# Patient Record
Sex: Male | Born: 1980 | Hispanic: Yes | Smoking: Former smoker
Health system: Southern US, Community
[De-identification: ages and names within clinical notes are randomized; demographics above are authoritative.]

## PROBLEM LIST (undated history)

## (undated) DIAGNOSIS — M549 Dorsalgia, unspecified: Secondary | ICD-10-CM

## (undated) HISTORY — PX: WISDOM TOOTH EXTRACTION: SHX21

---

## 2013-11-02 ENCOUNTER — Emergency Department (HOSPITAL_BASED_OUTPATIENT_CLINIC_OR_DEPARTMENT_OTHER): Payer: Self-pay

## 2013-11-02 ENCOUNTER — Emergency Department (HOSPITAL_BASED_OUTPATIENT_CLINIC_OR_DEPARTMENT_OTHER)
Admission: EM | Admit: 2013-11-02 | Discharge: 2013-11-02 | Disposition: A | Discharge status: Home / Self Care | Payer: Self-pay | Attending: Emergency Medicine | Admitting: Emergency Medicine

## 2013-11-02 ENCOUNTER — Encounter (HOSPITAL_BASED_OUTPATIENT_CLINIC_OR_DEPARTMENT_OTHER): Payer: Self-pay | Admitting: Emergency Medicine

## 2013-11-02 DIAGNOSIS — F17200 Nicotine dependence, unspecified, uncomplicated: Secondary | ICD-10-CM | POA: Insufficient documentation

## 2013-11-02 DIAGNOSIS — R079 Chest pain, unspecified: Secondary | ICD-10-CM

## 2013-11-02 DIAGNOSIS — R072 Precordial pain: Secondary | ICD-10-CM | POA: Insufficient documentation

## 2013-11-02 LAB — CBC
HEMATOCRIT: 40.1 % (ref 39.0–52.0)
Hemoglobin: 14.1 g/dL (ref 13.0–17.0)
MCH: 30.6 pg (ref 26.0–34.0)
MCHC: 35.2 g/dL (ref 30.0–36.0)
MCV: 87 fL (ref 78.0–100.0)
Platelets: 310 10*3/uL (ref 150–400)
RBC: 4.61 MIL/uL (ref 4.22–5.81)
RDW: 12.6 % (ref 11.5–15.5)
WBC: 9.9 10*3/uL (ref 4.0–10.5)

## 2013-11-02 LAB — BASIC METABOLIC PANEL
BUN: 13 mg/dL (ref 6–23)
CO2: 26 mEq/L (ref 19–32)
Calcium: 9.7 mg/dL (ref 8.4–10.5)
Chloride: 103 mEq/L (ref 96–112)
Creatinine, Ser: 1 mg/dL (ref 0.50–1.35)
GFR calc Af Amer: >90 mL/min (ref >90)
Glucose, Bld: 109 mg/dL — ABNORMAL HIGH (ref 70–99)
POTASSIUM: 3.9 meq/L (ref 3.7–5.3)
Sodium: 140 mEq/L (ref 137–147)

## 2013-11-02 LAB — D-DIMER, QUANTITATIVE: D-Dimer, Quant: 0.73 ug/mL-FEU — ABNORMAL HIGH (ref 0.00–0.48)

## 2013-11-02 LAB — TROPONIN I: Troponin I: <0.3 ng/mL (ref <0.30)

## 2013-11-02 MED ORDER — BUPIVACAINE HCL (PF) 0.5 % IJ SOLN
INTRAMUSCULAR | Status: AC
  Filled 2013-11-02: qty 10

## 2013-11-02 MED ORDER — SODIUM CHLORIDE 0.9 % IV BOLUS (SEPSIS)
1000.0000 mL | Freq: Once | INTRAVENOUS | Status: AC
  Administered 2013-11-02: 1000 mL via INTRAVENOUS

## 2013-11-02 MED ORDER — IBUPROFEN 800 MG PO TABS: 800.0000 mg | ORAL_TABLET | Freq: Once | ORAL | Status: DC

## 2013-11-02 MED ORDER — IOHEXOL 350 MG/ML SOLN
100.0000 mL | Freq: Once | INTRAVENOUS | Status: AC | PRN
  Administered 2013-11-02: 100 mL via INTRAVENOUS

## 2013-11-02 MED ORDER — IBUPROFEN 800 MG PO TABS
800.0000 mg | ORAL_TABLET | Freq: Once | ORAL | Status: AC
  Administered 2013-11-02: 800 mg via ORAL
  Filled 2013-11-02: qty 1

## 2013-11-02 NOTE — ED Notes (Signed)
MD at bedside. 

## 2013-11-02 NOTE — Discharge Instructions (Signed)
Chest Pain (Nonspecific) °It is often hard to give a specific diagnosis for the cause of chest pain. There is always a chance that your pain could be related to something serious, such as a heart attack or a blood clot in the lungs. You need to follow up with your caregiver for further evaluation. °CAUSES  °· Heartburn. °· Pneumonia or bronchitis. °· Anxiety or stress. °· Inflammation around your heart (pericarditis) or lung (pleuritis or pleurisy). °· A blood clot in the lung. °· A collapsed lung (pneumothorax). It can develop suddenly on its own (spontaneous pneumothorax) or from injury (trauma) to the chest. °· Shingles infection (herpes zoster virus). °The chest wall is composed of bones, muscles, and cartilage. Any of these can be the source of the pain. °· The bones can be bruised by injury. °· The muscles or cartilage can be strained by coughing or overwork. °· The cartilage can be affected by inflammation and become sore (costochondritis). °DIAGNOSIS  °Lab tests or other studies, such as X-rays, electrocardiography, stress testing, or cardiac imaging, may be needed to find the cause of your pain.  °TREATMENT  °· Treatment depends on what may be causing your chest pain. Treatment may include: °· Acid blockers for heartburn. °· Anti-inflammatory medicine. °· Pain medicine for inflammatory conditions. °· Antibiotics if an infection is present. °· You may be advised to change lifestyle habits. This includes stopping smoking and avoiding alcohol, caffeine, and chocolate. °· You may be advised to keep your head raised (elevated) when sleeping. This reduces the chance of acid going backward from your stomach into your esophagus. °· Most of the time, nonspecific chest pain will improve within 2 to 3 days with rest and mild pain medicine. °HOME CARE INSTRUCTIONS  °· If antibiotics were prescribed, take your antibiotics as directed. Finish them even if you start to feel better. °· For the next few days, avoid physical  activities that bring on chest pain. Continue physical activities as directed. °· Do not smoke. °· Avoid drinking alcohol. °· Only take over-the-counter or prescription medicine for pain, discomfort, or fever as directed by your caregiver. °· Follow your caregiver's suggestions for further testing if your chest pain does not go away. °· Keep any follow-up appointments you made. If you do not go to an appointment, you could develop lasting (chronic) problems with pain. If there is any problem keeping an appointment, you must call to reschedule. °SEEK MEDICAL CARE IF:  °· You think you are having problems from the medicine you are taking. Read your medicine instructions carefully. °· Your chest pain does not go away, even after treatment. °· You develop a rash with blisters on your chest. °SEEK IMMEDIATE MEDICAL CARE IF:  °· You have increased chest pain or pain that spreads to your arm, neck, jaw, back, or abdomen. °· You develop shortness of breath, an increasing cough, or you are coughing up blood. °· You have severe back or abdominal pain, feel nauseous, or vomit. °· You develop severe weakness, fainting, or chills. °· You have a fever. °THIS IS AN EMERGENCY. Do not wait to see if the pain will go away. Get medical help at once. Call your local emergency services (911 in [place] Do not drive yourself to the hospital. °MAKE SURE YOU:  °· Understand these instructions. °· Will watch your condition. °· Will get help right away if you are not doing well or get worse. °Document Released: 02/14/2005 Document Revised: 07/30/2011 Document Reviewed: 12/11/2007 °[employer]® Patient Information ©2014 [employer]. ° °

## 2013-11-02 NOTE — ED Provider Notes (Signed)
CSN: [id number]     Arrival date & time 11/02/13  1250 History   First MD Initiated Contact with Patient 11/02/13 1259     Chief Complaint  Patient presents with  . Chest Pain     (Consider location/radiation/quality/duration/timing/severity/associated sxs/prior Treatment) Patient is a 33 y.o. male presenting with chest pain. The history is provided by the patient.  Chest Pain Pain location:  Substernal area Pain quality: sharp   Pain radiates to:  Does not radiate Pain radiates to the back: no   Pain severity:  Moderate Onset quality:  Gradual Duration:  4 days Timing:  Constant Progression:  Unchanged Chronicity:  New Context: breathing and stress   Relieved by:  Nothing Worsened by:  Deep breathing Ineffective treatments:  None tried Associated symptoms: no abdominal pain, no cough, no fever, no shortness of breath and not vomiting     History reviewed. No pertinent past medical history. History reviewed. No pertinent past surgical history. History reviewed. No pertinent family history. History  Substance Use Topics  . Smoking status: Current Every Day Smoker -- 0.50 packs/day    Types: Cigarettes  . Smokeless tobacco: Not on file  . Alcohol Use: No    Review of Systems  Constitutional: Negative for fever and chills.  Respiratory: Negative for cough and shortness of breath.   Cardiovascular: Positive for chest pain.  Gastrointestinal: Negative for vomiting and abdominal pain.  All other systems reviewed and are negative.     Allergies  Review of patient's allergies indicates no known allergies.  Home Medications   Prior to Admission medications   Not on File   BP 128/72  Pulse 79  Temp(Src) 98.3 F (36.8 C)  Resp 16  Ht 5\' 9"  (1.753 m)  Wt 210 lb (95.255 kg)  BMI 31.00 kg/m2  SpO2 100% Physical Exam  Constitutional: He is oriented to person, place, and time. He appears well-developed and well-nourished. No distress.  HENT:  Head: Normocephalic  and atraumatic.  Mouth/Throat: Oropharynx is clear and moist. No oropharyngeal exudate.  Eyes: EOM are normal. Pupils are equal, round, and reactive to light.  Neck: Normal range of motion. Neck supple.  Cardiovascular: Normal rate and regular rhythm.  Exam reveals no friction rub.   No murmur heard. Pulmonary/Chest: Effort normal and breath sounds normal. No respiratory distress. He has no wheezes. He has no rales. He exhibits no tenderness.  Abdominal: He exhibits no distension. There is no tenderness. There is no rebound.  Musculoskeletal: Normal range of motion. He exhibits no edema.  Neurological: He is alert and oriented to person, place, and time.  Skin: Skin is warm. No rash noted. He is not diaphoretic.    ED Course  Procedures (including critical care time) Labs Review Labs Reviewed  CBC  BASIC METABOLIC PANEL  TROPONIN I  D-DIMER, QUANTITATIVE    Imaging Review Ct Angio Chest Pe W/cm &/or Wo Cm  11/02/2013   CLINICAL DATA:  Chest pain.  EXAM: CT ANGIOGRAPHY CHEST WITH CONTRAST  TECHNIQUE: Multidetector CT imaging of the chest was performed using the standard protocol during bolus administration of intravenous contrast. Multiplanar CT image reconstructions and MIPs were obtained to evaluate the vascular anatomy.  CONTRAST:  100mL OMNIPAQUE IOHEXOL 350 MG/ML SOLN  COMPARISON:  None.  FINDINGS: The chest wall is unremarkable. The bony thorax is intact. No acute bony findings or destructive bony changes. No spinal canal compromise.  The heart is normal in size. No pericardial effusion. No mediastinal or hilar mass  or adenopathy. The aorta is normal in caliber. No dissection. The esophagus is grossly normal.  The pulmonary arterial tree is fairly well opacified. No filling defects to suggest pulmonary emboli.  Examination of the lung parenchyma demonstrates minimal dependent atelectasis but no infiltrates, edema or effusions. No worrisome pulmonary lesions. Tracheobronchial tree is  unremarkable.  The upper abdomen is unremarkable.  Review of the MIP images confirms the above findings.  IMPRESSION: No CT findings for pulmonary embolism.  No acute pulmonary findings.   Electronically Signed   By: Loralie ChampagneMark  Gallerani M.D.   On: 11/02/2013 14:49     EKG Interpretation   Date/Time:  Monday November 02 2013 13:03:34 EDT Ventricular Rate:  79 PR Interval:  146 QRS Duration: 94 QT Interval:  374 QTC Calculation: 428 R Axis:   87 Text Interpretation:  Normal sinus rhythm Normal ECG Similar to prior  Confirmed by Gwendolyn GrantWALDEN  MD, Rickeya Manus ([id number]) on 11/02/2013 2:54:46 PM      MDM   Final diagnoses:  Chest pain    70M with no hx presents with sharp chest pain, central, nonradiating. Worse with deep breaths. No alleviating factors. He thinks it is due to stress. No fevers, cough, N/V, SOB.  EKG ok, no PR depression or diffuse ST elevation. No pain on palpation, no pain with chest movement. With no reproducibility, will do labs, D-dimer. Cannot PERC since he is a smoker. Dimer elevated, CT scan negative for PE. CP likely from stress. Stable for discharge. I have reviewed all labs and imaging and considered them in my medical decision making.   Dagmar HaitWilliam Kaleb Linquist, MD 11/02/13 [phone]

## 2013-11-02 NOTE — ED Notes (Signed)
Pt c/o Chest pain/sob while laying down x 4 days

## 2014-12-16 ENCOUNTER — Emergency Department (HOSPITAL_BASED_OUTPATIENT_CLINIC_OR_DEPARTMENT_OTHER)
Admission: EM | Admit: 2014-12-16 | Discharge: 2014-12-16 | Disposition: A | Discharge status: Home / Self Care | Payer: Self-pay | Attending: Emergency Medicine | Admitting: Emergency Medicine

## 2014-12-16 ENCOUNTER — Encounter (HOSPITAL_BASED_OUTPATIENT_CLINIC_OR_DEPARTMENT_OTHER): Payer: Self-pay | Admitting: *Deleted

## 2014-12-16 DIAGNOSIS — Z720 Tobacco use: Secondary | ICD-10-CM | POA: Insufficient documentation

## 2014-12-16 DIAGNOSIS — J020 Streptococcal pharyngitis: Secondary | ICD-10-CM | POA: Insufficient documentation

## 2014-12-16 LAB — RAPID STREP SCREEN (MED CTR MEBANE ONLY): STREPTOCOCCUS, GROUP A SCREEN (DIRECT): POSITIVE — AB

## 2014-12-16 MED ORDER — PENICILLIN G BENZATHINE 1200000 UNIT/2ML IM SUSP
1.2000 10*6.[IU] | Freq: Once | INTRAMUSCULAR | Status: AC
  Administered 2014-12-16: 1.2 10*6.[IU] via INTRAMUSCULAR
  Filled 2014-12-16: qty 2

## 2014-12-16 MED ORDER — HYDROCODONE-ACETAMINOPHEN 7.5-325 MG/15ML PO SOLN
10.0000 mL | Freq: Once | ORAL | Status: DC
  Filled 2014-12-16: qty 15

## 2014-12-16 MED ORDER — HYDROCODONE-ACETAMINOPHEN 7.5-325 MG/15ML PO SOLN
15.0000 mL | Freq: Once | ORAL | Status: AC
  Administered 2014-12-16: 15 mL via ORAL

## 2014-12-16 MED ORDER — HYDROCODONE-ACETAMINOPHEN 7.5-325 MG/15ML PO SOLN: 10.0000 mL | Freq: Four times a day (QID) | ORAL | Status: DC | PRN

## 2014-12-16 NOTE — ED Notes (Signed)
Sore throat x 3 days- reports family member has had strep within the last month

## 2014-12-16 NOTE — ED Notes (Signed)
MD at bedside. 

## 2014-12-16 NOTE — Discharge Instructions (Signed)
Return to the ED with any concerns including difficulty breathing or swallowing, vomiting and not able to keep down liquids, decreased level of alertness/lethargy, or any other alarming symptoms °

## 2014-12-16 NOTE — ED Notes (Signed)
Pt d/c home with ride- directed to pharmacy to pick up medications

## 2014-12-16 NOTE — ED Provider Notes (Signed)
CSN: [id number]     Arrival date & time 12/16/14  4098 History   First MD Initiated Contact with Patient 12/16/14 [phone]     Chief Complaint  Patient presents with  . Sore Throat     (Consider location/radiation/quality/duration/timing/severity/associated sxs/prior Treatment) HPI  Pt presenting with c/o sore throat which has been present for the past 3 days.  Pain is in back of throat, worse with swallowing.  He has been drinking liquids well, but has not been able to eat solid foods.  No vomiting or diarrhea. No cough or difficulty breathing.  Was exposed to strep within the past month.  No rash.  He has tried ibuprofen without much relief.  Pain is constant and severe.   There are no other associated systemic symptoms, there are no other alleviating or modifying factors.   History reviewed. No pertinent past medical history. Past Surgical History  Procedure Laterality Date  . Wisdom tooth extraction     No family history on file. History  Substance Use Topics  . Smoking status: Current Every Day Smoker -- 0.50 packs/day    Types: Cigarettes  . Smokeless tobacco: Never Used  . Alcohol Use: No    Review of Systems  ROS reviewed and all otherwise negative except for mentioned in HPI    Allergies  Review of patient's allergies indicates no known allergies.  Home Medications   Prior to Admission medications   Medication Sig Start Date End Date Taking? Authorizing Provider  HYDROcodone-acetaminophen (HYCET) 7.5-325 mg/15 ml solution Take 10 mLs by mouth 4 (four) times daily as needed for moderate pain. 12/16/14 12/16/15  Jerelyn Scott, MD  ibuprofen (ADVIL,MOTRIN) 800 MG tablet Take 1 tablet (800 mg total) by mouth once. 11/02/13   Elwin Mocha, MD   BP 104/40 mmHg  Pulse 89  Temp(Src) 98.7 F (37.1 C) (Oral)  Resp 16  Ht  (1.727 m)  Wt 219 lb (99.338 kg)  BMI 33.31 kg/m2  SpO2 99%  Vitals reviewed Physical Exam  Physical Examination: General appearance - alert, well  appearing, and in no distress Mental status - alert, oriented to person, place, and time Eyes - no conjunctival injection, no scleral icterus Mouth - MMM, OP with moderate erythema, tonsils 3+ with bilateral exudate, palate symmetric, uvula midline Neck - supple, no significant adenopathy Chest - clear to auscultation, no wheezes, rales or rhonchi, symmetric air entry Heart - normal rate, regular rhythm, normal S1, S2, no murmurs, rubs, clicks or gallops Abdomen - soft, nontender, nondistended, no masses or organomegaly Neurological - alert, oriented, normal speech, Extremities - peripheral pulses normal, no pedal edema, no clubbing or cyanosis Skin - normal coloration and turgor, no rashes  ED Course  Procedures (including critical care time) Labs Review Labs Reviewed  RAPID STREP SCREEN (NOT AT [facility]) - Abnormal; Notable for the following:    Streptococcus, Group A Screen (Direct) POSITIVE (*)    All other components within normal limits    Imaging Review No results found.   EKG Interpretation None      MDM   Final diagnoses:  Strep pharyngitis    Pt with strep pharyngitis. Pt treated with bicillin and given lortab elixir for pain control.  He is drinking liquids at home, but decreased solids due to pain.  Encouraged to continue to work on po hydration.  No signs of PTA or other acute complication at this time.  Discharged with strict return precautions.  Pt agreeable with plan.    Johnny Bridge  Linker, MD 12/18/14 1445

## 2015-04-29 ENCOUNTER — Encounter (HOSPITAL_BASED_OUTPATIENT_CLINIC_OR_DEPARTMENT_OTHER): Payer: Self-pay | Admitting: *Deleted

## 2015-04-29 ENCOUNTER — Emergency Department (HOSPITAL_BASED_OUTPATIENT_CLINIC_OR_DEPARTMENT_OTHER)
Admission: EM | Admit: 2015-04-29 | Discharge: 2015-04-29 | Disposition: A | Discharge status: Home / Self Care | Payer: Self-pay | Attending: Emergency Medicine | Admitting: Emergency Medicine

## 2015-04-29 ENCOUNTER — Emergency Department (HOSPITAL_BASED_OUTPATIENT_CLINIC_OR_DEPARTMENT_OTHER): Payer: Self-pay

## 2015-04-29 DIAGNOSIS — Y9389 Activity, other specified: Secondary | ICD-10-CM | POA: Insufficient documentation

## 2015-04-29 DIAGNOSIS — W1840XA Slipping, tripping and stumbling without falling, unspecified, initial encounter: Secondary | ICD-10-CM | POA: Insufficient documentation

## 2015-04-29 DIAGNOSIS — S99922A Unspecified injury of left foot, initial encounter: Secondary | ICD-10-CM | POA: Insufficient documentation

## 2015-04-29 DIAGNOSIS — M722 Plantar fascial fibromatosis: Secondary | ICD-10-CM | POA: Insufficient documentation

## 2015-04-29 DIAGNOSIS — S99921A Unspecified injury of right foot, initial encounter: Secondary | ICD-10-CM | POA: Insufficient documentation

## 2015-04-29 DIAGNOSIS — Y990 Civilian activity done for income or pay: Secondary | ICD-10-CM | POA: Insufficient documentation

## 2015-04-29 DIAGNOSIS — F17210 Nicotine dependence, cigarettes, uncomplicated: Secondary | ICD-10-CM | POA: Insufficient documentation

## 2015-04-29 DIAGNOSIS — G5601 Carpal tunnel syndrome, right upper limb: Secondary | ICD-10-CM | POA: Insufficient documentation

## 2015-04-29 DIAGNOSIS — Y9289 Other specified places as the place of occurrence of the external cause: Secondary | ICD-10-CM | POA: Insufficient documentation

## 2015-04-29 MED ORDER — IBUPROFEN 800 MG PO TABS: 800.0000 mg | ORAL_TABLET | Freq: Three times a day (TID) | ORAL | Status: DC

## 2015-04-29 MED ORDER — IBUPROFEN 800 MG PO TABS
800.0000 mg | ORAL_TABLET | Freq: Once | ORAL | Status: AC
  Administered 2015-04-29: 800 mg via ORAL
  Filled 2015-04-29: qty 1

## 2015-04-29 NOTE — Discharge Instructions (Signed)
1. Medications: alternate naprosyn and tylenol for pain control, usual home medications 2. Treatment: rest, ice, elevate and use brace, drink plenty of fluids, gentle stretching 3. Follow Up: Please followup with orthopedics as directed or your PCP in 1 week if no improvement for discussion of your diagnoses and further evaluation after today's visit; if you do not have a primary care doctor use the resource guide provided to find one; Please return to the ER for worsening symptoms or other concerns    Plantar Fasciitis Plantar fasciitis is a painful foot condition that affects the heel. It occurs when the band of tissue that connects the toes to the heel bone (plantar fascia) becomes irritated. This can happen after exercising too much or doing other repetitive activities (overuse injury). The pain from plantar fasciitis can range from mild irritation to severe pain that makes it difficult for you to walk or move. The pain is usually worse in the morning or after you have been sitting or lying down for a while. CAUSES This condition may be caused by:  Standing for long periods of time.  Wearing shoes that do not fit.  Doing high-impact activities, including running, aerobics, and ballet.  Being overweight.  Having an abnormal way of walking (gait).  Having tight calf muscles.  Having high arches in your feet.  Starting a new athletic activity. SYMPTOMS The main symptom of this condition is heel pain. Other symptoms include:  Pain that gets worse after activity or exercise.  Pain that is worse in the morning or after resting.  Pain that goes away after you walk for a few minutes. DIAGNOSIS This condition may be diagnosed based on your signs and symptoms. Your health care provider will also do a physical exam to check for:  A tender area on the bottom of your foot.  A high arch in your foot.  Pain when you move your foot.  Difficulty moving your foot. You may also need to  have imaging studies to confirm the diagnosis. These can include:  X-rays.  Ultrasound.  MRI. TREATMENT  Treatment for plantar fasciitis depends on the severity of the condition. Your treatment may include:  Rest, ice, and over-the-counter pain medicines to manage your pain.  Exercises to stretch your calves and your plantar fascia.  A splint that holds your foot in a stretched, upward position while you sleep (night splint).  Physical therapy to relieve symptoms and prevent problems in the future.  Cortisone injections to relieve severe pain.  Extracorporeal shock wave therapy (ESWT) to stimulate damaged plantar fascia with electrical impulses. It is often used as a last resort before surgery.  Surgery, if other treatments have not worked after 12 months. HOME CARE INSTRUCTIONS  Take medicines only as directed by your health care provider.  Avoid activities that cause pain.  Roll the bottom of your foot over a bag of ice or a bottle of cold water. Do this for 20 minutes, 3-4 times a day.  Perform simple stretches as directed by your health care provider.  Try wearing athletic shoes with air-sole or gel-sole cushions or soft shoe inserts.  Wear a night splint while sleeping, if directed by your health care provider.  Keep all follow-up appointments with your health care provider. PREVENTION   Do not perform exercises or activities that cause heel pain.  Consider finding low-impact activities if you continue to have problems.  Lose weight if you need to. The best way to prevent plantar fasciitis is to  avoid the activities that aggravate your plantar fascia. SEEK MEDICAL CARE IF:  Your symptoms do not go away after treatment with home care measures.  Your pain gets worse.  Your pain affects your ability to move or do your daily activities.   This information is not intended to replace advice given to you by your health care provider. Make sure you discuss any  questions you have with your health care provider.   Document Released: 01/30/2001 Document Revised: 01/26/2015 Document Reviewed: 03/17/2014 Elsevier Interactive Patient Education 2016 [employer].    Emergency Department Resource Guide 1) Find a Doctor and Pay Out of Pocket Although you won't have to find out who is covered by your insurance plan, it is a good idea to ask around and get recommendations. You will then need to call the office and see if the doctor you have chosen will accept you as a new patient and what types of options they offer for patients who are self-pay. Some doctors offer discounts or will set up payment plans for their patients who do not have insurance, but you will need to ask so you aren't surprised when you get to your appointment.  2) Contact Your Local Health Department Not all health departments have doctors that can see patients for sick visits, but many do, so it is worth a call to see if yours does. If you don't know where your local health department is, you can check in your phone book. The CDC also has a tool to help you locate your state's health department, and many state websites also have listings of all of their local health departments.  3) Find a Walk-in Clinic If your illness is not likely to be very severe or complicated, you may want to try a walk in clinic. These are popping up all over the country in pharmacies, drugstores, and shopping centers. They're usually staffed by nurse practitioners or physician assistants that have been trained to treat common illnesses and complaints. They're usually fairly quick and inexpensive. However, if you have serious medical issues or chronic medical problems, these are probably not your best option.  No Primary Care Doctor: - Call Health Connect at  [phone] - they can help you locate a primary care doctor that  accepts your insurance, provides certain services, etc. - Physician Referral Service-  [phone]  Chronic Pain Problems: Organization         Address  Phone   Notes  [facility]  [phone] Patients need to be referred by their primary care doctor.   Medication Assistance: Organization         Address  Phone   Notes  [facility] Medication [facility] [address] [place] [place], [place] [address] [phone] --Must be a resident of [facility] -- Must have NO insurance coverage whatsoever (no Medicaid/ Medicare, etc.) -- The pt. MUST have a primary care doctor that directs their care regularly and follows them in the community   [facility]ist  [phone]   [employer]  [phone]    Agencies that provide inexpensive medical care: Organization         Address  Phone   Notes  [facility]  [phone]   [facility]    [phone]   [facility] [address] [place], [place] [address] [phone]   [facility] [address] [place] [place] [phone]  Planned Parenthood    [phone]   [facility]    [phone]   [facility]  [address], [place] Phone:  [phone], Fax:  [phone] Hours of Operation:  9 am - 6 pm, M-F.  Also accepts Medicaid/Medicare and self-pay.  [facility]  [address], [place] Phone: [phone], Fax: [phone]. Hours of Operation:  8:30 am - 5:30 pm, M-F.  Also accepts Medicaid and self-pay.  [facility] [place] [address], [place] Phone: [phone]   [facility] [address] Natasha Bence [place], [place] [phone], Ext. [phone] Mondays & Thursdays: 7-9 AM.  First 15 patients are seen on a first come, first serve basis.    Medicaid-accepting [facility] Providers:  Organization         Address  Phone   Notes  [facility] [address],  [place] [phone] Also accepts self-pay patients.  [facility] [address] Laurell Josephs [place], [place]  [phone]   [facility] [address], [place] [phone]   [facility] [address] [place] [phone]   Renaye Rakers [address], [place]   [phone] Only accepts [place] patients after they have their name applied to their card.   Self-Pay (no insurance) in [facility]:  Organization         Address  Phone   Notes  Sick[facility] Patients, [facility] [address] [place], [place] [phone]   [facility] [address] [place], [place] [phone]   [facility] [place]  [address], [place] [phone]   [facility]/Dr. Osei-Bonsu  [address] [place] or [address], [place] [phone] Phone number for both [place] and [place] locations is the same.  [facility] [address] [place] [phone]   [facility] [address] [place] or [address] [phone]   [facility] [address] [place] [phone], phone; [phone], fax Sees patients 1st and 3rd Saturday of every month.  Must not qualify for public or private insurance (i.e. Medicaid, Medicare, [place] Health Choice, Veterans' Benefits)  Household income should be no more than 200% of the poverty level The clinic cannot treat you if you are pregnant or think you are pregnant  Sexually transmitted diseases are not treated at the clinic.    Dental Care: Organization         Address  Phone  Notes  [facility] Department of [facility] [address] [place], [place] [phone] Accepts children up to age 70 who are enrolled in  [place] or [place] Health Choice; pregnant women with a Medicaid card; and children who have applied for Medicaid or [place] Health Choice, but were declined, whose parents can pay a reduced fee at time of service.  [facility]  [address], [place] [phone] Accepts children up to age 45 who are enrolled in [place] or [place] Health Choice; pregnant women with a Medicaid card; and children who have applied for Medicaid or [place] Health Choice, but were declined,  whose parents can pay a reduced fee at time of service.  [facility]  [address] [place], [place] [phone] Patients are seen by appointment only. Walk-ins are not accepted. [facility] will see patients 2 years of age and older. Monday - Tuesday (8am-5pm) Most Wednesdays (8:30-5pm) $30 per visit, cash only  [facility] Access [facility]  [address], [facility] [phone] Patients are seen by appointment only. Walk-ins are not accepted. [facility] will see patients 60 years of age and older. One Wednesday Evening (Monthly: Volunteer Based).  $30 per visit, cash only  [employer]  [phone] for adults; Children under age 63, call Graduate Pediatric Dentistry at [phone]. Children aged 35-14, please call [phone] to request a pediatric application.  Dental services are provided in all areas of dental care including fillings, crowns and bridges, complete and partial dentures, implants, gum treatment, root canals, and extractions. Preventive care is also provided. Treatment is provided to both adults and children. Patients are selected via a lottery and there is often a waiting list.   [facility] [address] [place]  [phone] [email or web address]   [facility] [address] [place], [place] [phone], Ext. [phone] Second and Fourth Thursday of each month, opens at 6:30  AM; Clinic ends at 9 AM.  Patients are seen on a first-come first-served basis, and a limited number are seen during each clinic.   [facility]  [address] Ether Griffins [place], [place] [phone]   Eligibility Requirements You must have lived in [place], [place], or [place] counties for at least the last three months.   You cannot be eligible for state or federal sponsored [place], including [employer], [place], or [employer].   You generally cannot be eligible for healthcare insurance through your employer.    How to apply: Eligibility screenings are held every Tuesday and Wednesday afternoon from 1:00 pm until 4:00 pm. You do not need an appointment for the interview!  [facility] [address] [place], [place] [phone]   [facility] Health Department  [phone]   [facility] Department  [phone]   [facility] Health Department  [phone]    Behavioral Health Resources in the Community: Intensive Outpatient Programs Organization         Address  Phone  Notes  [facility] [address], [place], [place] [phone]   [facility] [address] [place], [place] [phone]   [facility] [address], [place], [place]  [phone]   [facility] Mental Health [address],  [place], [place] [phone] or [phone]   Substance Abuse Resources Organization         Address  Phone  Notes  [facility]  [phone]   [facility]  [phone]   [facility]  [phone]   Floydene Flock  [phone]   Residential & Outpatient Substance Abuse Program  [phone]   Psychological Services Organization         Address  Phone  Notes  [facility] Behavioral Health  [phone]   [facility]  [phone]   [facility] Mental [facility] [address] [place] [phone] or  [phone]    Mobile Crisis Teams Organization         Address  Phone  Notes  [facility], Mobile  Crisis Care Unit  [phone]   [facility]  [address] [place], [place] [phone]   [facility] [address] [place] [phone]    Self-Help/Support Groups Organization         Address  Phone             Notes  [facility] [place] - variety of support groups  336- [phone] Call for more information  Narcotics Anonymous (NA), [facility] [address], [place]  2 meetings at this location   Statisticianesidential Treatment Programs Organization         Address  Phone  Notes  [facility] [address],    [place]  [phone]   [facility]  [address], [place] [phone], [place], [place] [phone]   [facility] [address], [place] [phone] Admissions: 8am-3pm M-F  [facility] [address]    [place], [place] [phone]   [facility] [address], [place], [place] [phone]   [facility] [address]  [place], [place] [phone]   [facility] - Intensive Outpatient [address], [place], [place] [phone]   [facility] ([facility] [address]  [place], [place] [phone] or [phone]   [facility] ([facility]) [address] [place], [place] [phone] Accepts Medicaid  [facility] [address]  [place] [phone] Substance Abuse/Addiction Treatment   [facility] Behavioral Health Resources Organization         Address  Phone  Notes  [facility]  [phone]) [phone]   Angie FavaJulie Brannon, PhD [address] [place], [place]   [phone](336) [phone] or [phone]   [facility]   [address] [place], [place] [phone]) [phone]   [facility] [address],  [place], [place] [phone]) [phone] Insurance/Medicaid/sponsorship through [facility]  [facility] [address]                                    [place], [place] [phone]) [phone] Therapy/tele-psych/case  [facility] [address] [place].   [place], [place] [phone]    Dr. Lolly MustacheArfeen  [phone](336) [phone]   [facility]  [employer] [facility] 1) [address], [place] 2) [address], [place] 3)  [address], [place] [phone](336) [phone](336) [phone]  [phone](336) [phone]   [facility] Child Abuse Hotline [phone](336) 7060474661 or [phone](336) [phone] (After Hours)

## 2015-04-29 NOTE — ED Notes (Signed)
Pt c/o bilateral foot pain for "months" worse when at work - pt works on his feet all day.  Pt has tried different shoes and insoles with no relief.  Pt states pain is worst in middle of his arch.  Saturday, pt hit his left heel on a metal chair at work, then tripping, and has since has lateral foot pain, worse with palpation and flexion of 4th and 5th left toes.

## 2015-04-29 NOTE — ED Notes (Signed)
Per pt report both feet hurting since working at [employer] about month ago then last week a metal jack rolled over lt foot.

## 2015-04-29 NOTE — ED Provider Notes (Signed)
CSN: [id number]     Arrival date & time 04/29/15  1436 History   None    Chief Complaint  Patient presents with  . Foot Pain     (Consider location/radiation/quality/duration/timing/severity/associated sxs/prior Treatment) The history is provided by the patient and medical records. No language interpreter was used.     Maxwell Kelly is a 34 y.o. male  with no known medical problems presents to the Emergency Department complaining of gradual, persistent, progressively worsening bilateral foot pain onset 1 month ago after beginning a new job at [employer]. Patient reports pain is worst in the morning after waking. He reports pain is also worse after being on his feet all day. He describes the pain as a burning, worst in the arch of his front and radiating to the ball of the foot and back to the heel. No treatments prior to arrival.   Patient reports that  6 days ago he struck his heel on a metal chair and then one of the jacks rolled over his left lateral foot. He reports increased pain in the left foot since this time. He denies numbness, weakness, tingling. He reports that he walks but it is painful.   He denies swelling or color change.   In addition patient complains about intermittent paresthesias in the right hand. He reports that it only happens when he is working on his computer at home. He reports buying a home wrist rest which seems to exacerbate the problem.  He reports bending his hand makes the symptoms worse as well. He denies weakness, dropping items from the hand, loss of sensation or paresthesias radiating up the forearm or into the shoulder.  He denies swelling, pain with palpation, erythema or fevers.     History reviewed. No pertinent past medical history. Past Surgical History  Procedure Laterality Date  . Wisdom tooth extraction     History reviewed. No pertinent family history. Social History  Substance Use Topics  . Smoking status: Current Every Day Smoker -- 0.50  packs/day    Types: Cigarettes  . Smokeless tobacco: Never Used  . Alcohol Use: No    Review of Systems  Constitutional: Negative for fever and chills.  Gastrointestinal: Negative for nausea and vomiting.  Musculoskeletal: Positive for arthralgias ( Bilateral foot pain). Negative for back pain, joint swelling, neck pain and neck stiffness.  Skin: Negative for wound.  Neurological: Negative for numbness.       Right hand paresthesia  Hematological: Does not bruise/bleed easily.  Psychiatric/Behavioral: The patient is not nervous/anxious.   All other systems reviewed and are negative.     Allergies  Review of patient's allergies indicates no known allergies.  Home Medications   Prior to Admission medications   Medication Sig Start Date End Date Taking? Authorizing Provider  ibuprofen (ADVIL,MOTRIN) 800 MG tablet Take 1 tablet (800 mg total) by mouth 3 (three) times daily. 04/29/15   Miken Stecher, PA-C   BP 121/74 mmHg  Pulse 73  Temp(Src) 97.8 F (36.6 C) (Oral)  Resp 20  Ht 5\' 8"  (1.727 m)  Wt 103.193 kg  BMI 34.60 kg/m2  SpO2 99% Physical Exam  Constitutional: He appears well-developed and well-nourished. No distress.  HENT:  Head: Normocephalic and atraumatic.  Eyes: Conjunctivae are normal.  Neck: Normal range of motion.  Cardiovascular: Normal rate, regular rhythm and intact distal pulses.   Capillary refill < 3 sec  Pulmonary/Chest: Effort normal and breath sounds normal.  Musculoskeletal: He exhibits tenderness. He exhibits no edema.  ROM: Full range of motion of all major joints in the bilateral lower extremities Tenderness to palpation of the arch of the foot with reproducible burning pain along the plantar fascia No swelling or erythema noted to the soles of the feet Full range of motion of all major joints in the bilateral upper extremities Positive Phalen's and Tinel's at the right wrist Tenderness to palpation along the lateral portion of the left  foot including the left fifth and fourth toes  Neurological: He is alert. Coordination normal.  Sensation intact at all and sharp in all 4 extremities Strength 5/5 of all major joints in all 4 extremities, strong and equal grip strength, normal gait and balance  Skin: Skin is warm and dry. He is not diaphoretic.  No tenting of the skin  Psychiatric: He has a normal mood and affect.  Nursing note and vitals reviewed.   ED Course  Procedures (including critical care time)  Imaging Review Dg Foot Complete Left  04/29/2015  CLINICAL DATA:  Bilateral foot pain.  Worse at work. EXAM: LEFT FOOT - COMPLETE 3+ VIEW COMPARISON:  None. FINDINGS: No fracture or dislocation of mid foot or forefoot. The phalanges are normal. The calcaneus is normal. No soft tissue abnormality. IMPRESSION: No acute osseous abnormality. Electronically Signed   By: Genevive Bi M.D.   On: 04/29/2015 15:52   I have personally reviewed and evaluated these images and lab results as part of my medical decision-making.    MDM   Final diagnoses:  Plantar fasciitis, bilateral  Foot injury, left, initial encounter  Carpal tunnel syndrome of right wrist   Maxwell Kelly presents with clinical exam consistent with carpal tunnel syndrome of the right wrist, bilateral plantar fasciitis.  X-ray of the left foot obtained due to tenderness along the left foot and fourth and fifth toes after injury approximately 1 week ago.  Patient X-Ray negative for obvious fracture or dislocation. Pain managed in ED. Pt advised to follow up with podiatry for further evaluation of his plantar fasciitis. Patient given wrist splint for carpal tunnel and encouraged to follow-up with primary care physician. Conservative therapy recommended and discussed. Patient will be dc home & is agreeable with above plan.   BP 121/74 mmHg  Pulse 73  Temp(Src) 97.8 F (36.6 C) (Oral)  Resp 20  Ht  (1.727 m)  Wt 103.193 kg  BMI 34.60 kg/m2  SpO2  99%   Dierdre Forth, PA-C 04/29/15 1625  Lyndal Pulley, MD 04/29/15 [phone]

## 2016-01-25 IMAGING — CT CT ANGIO CHEST
2 of 6 series · 19 of 36 positions shown · IV contrast (APPLIED)
Comparison: None.

CLINICAL DATA: Chest pain.

EXAM:
CT ANGIOGRAPHY CHEST WITH CONTRAST
TECHNIQUE: Multidetector CT imaging of the chest was performed using the
standard protocol during bolus administration of intravenous
contrast. Multiplanar CT image reconstructions and MIPs were
obtained to evaluate the vascular anatomy.
CONTRAST:  100mL OMNIPAQUE IOHEXOL 350 MG/ML SOLN

[Series 6: pe 1.0 b26f · axial · 0.68mm/px · z∈[-160,+56]mm · 18 of 242 slices shown]
[im 13/242  lung]
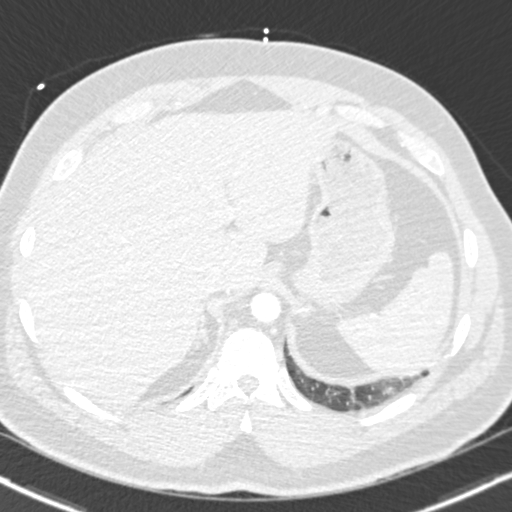
[im 25/242  mediastinal]
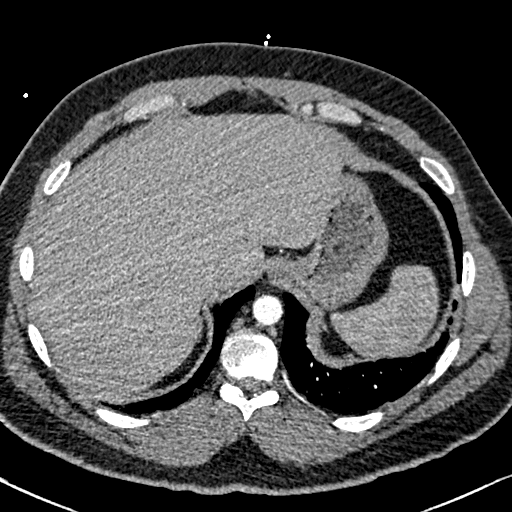
[im 37/242  lung]
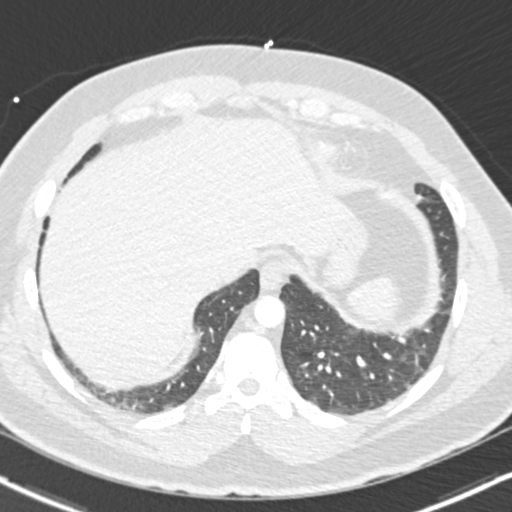
[im 49/242  mediastinal]
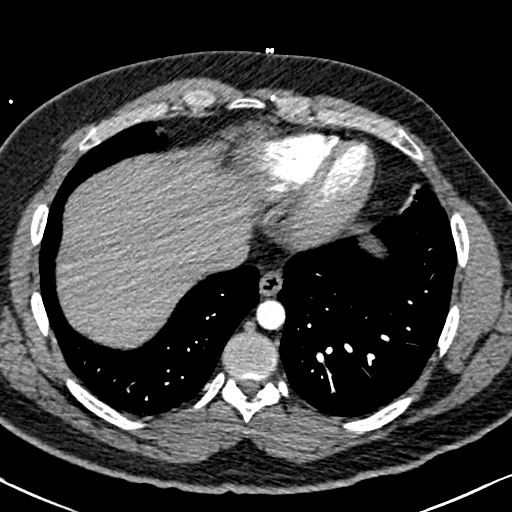
[im 61/242  lung]
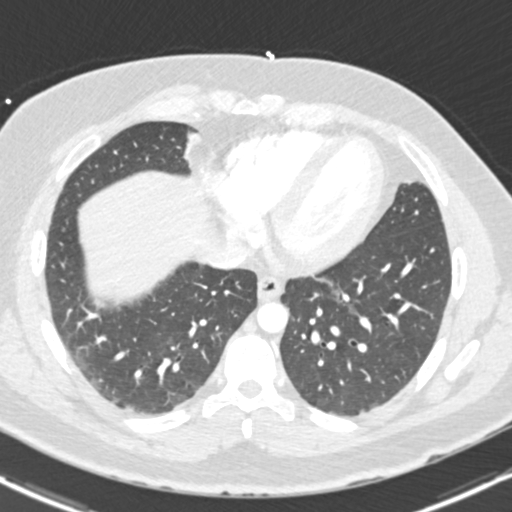
[im 73/242  mediastinal]
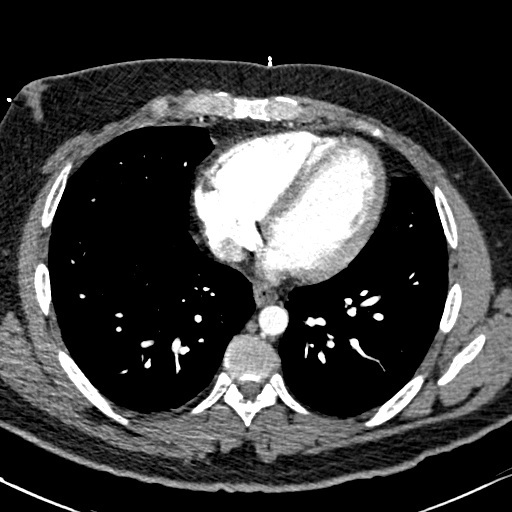
[im 85/242  lung]
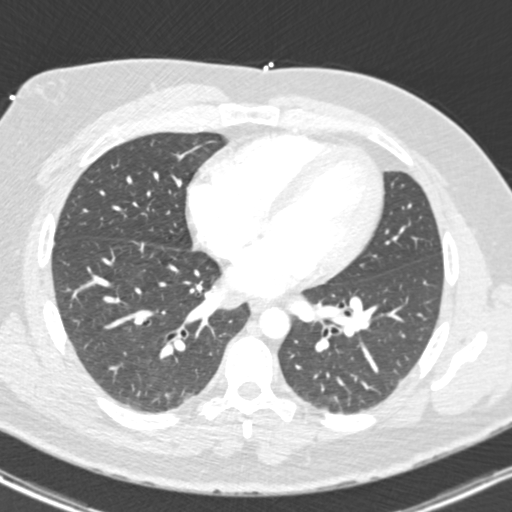
[im 97/242  mediastinal]
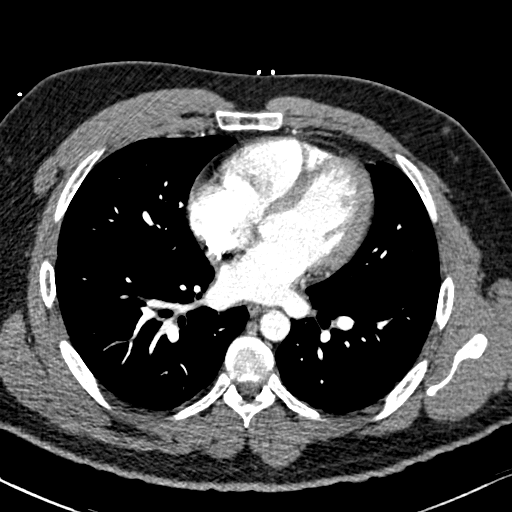
[im 109/242  lung]
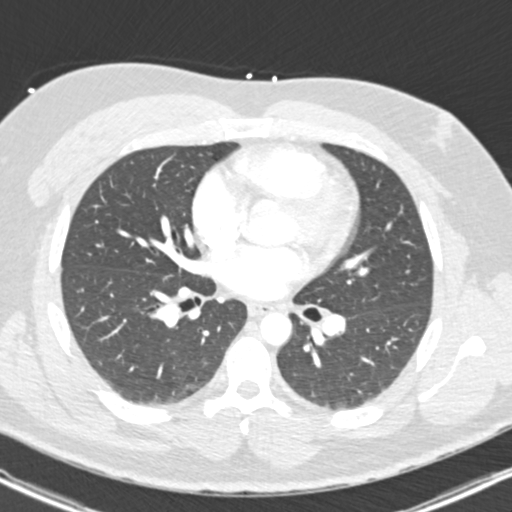
[im 133/242  mediastinal]
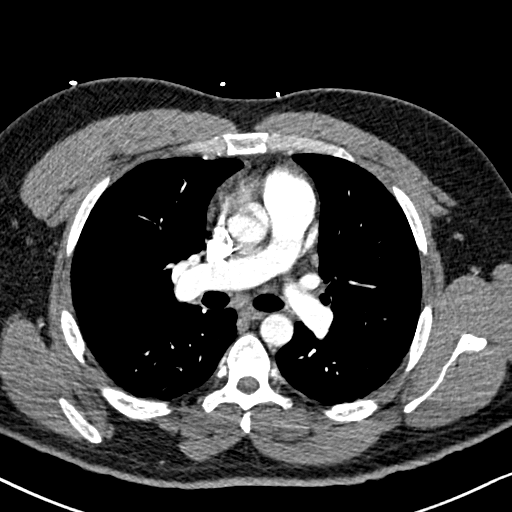
[im 145/242  lung]
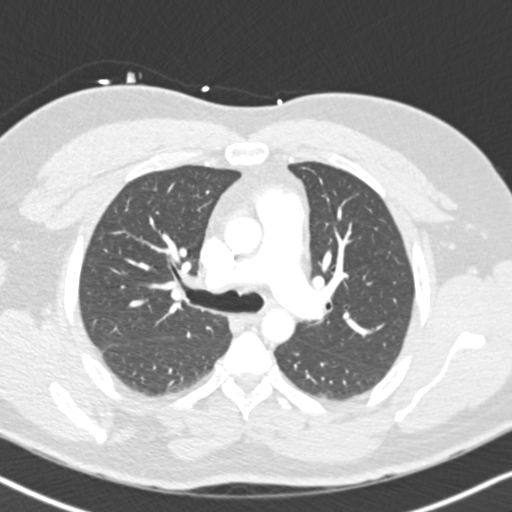
[im 157/242  mediastinal]
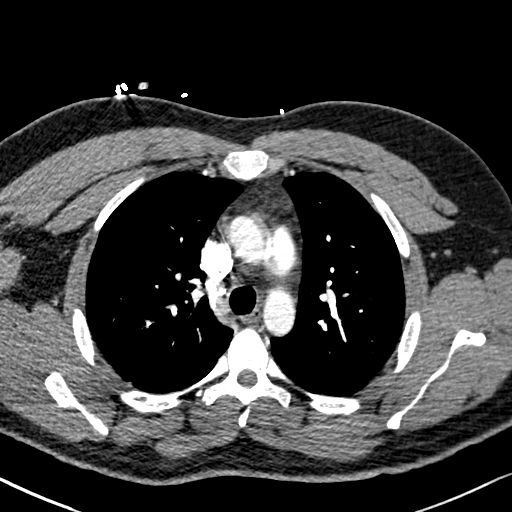
[im 169/242  lung]
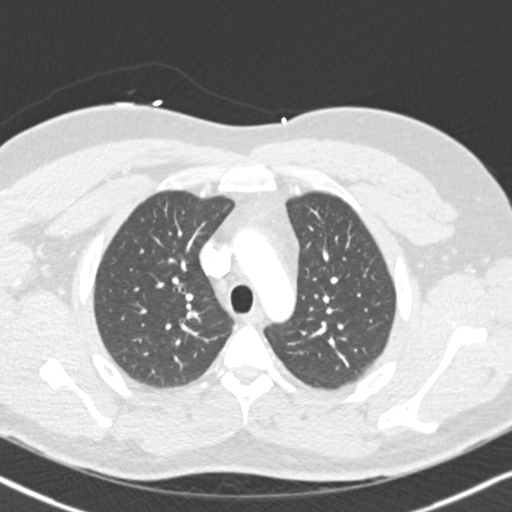
[im 181/242  mediastinal]
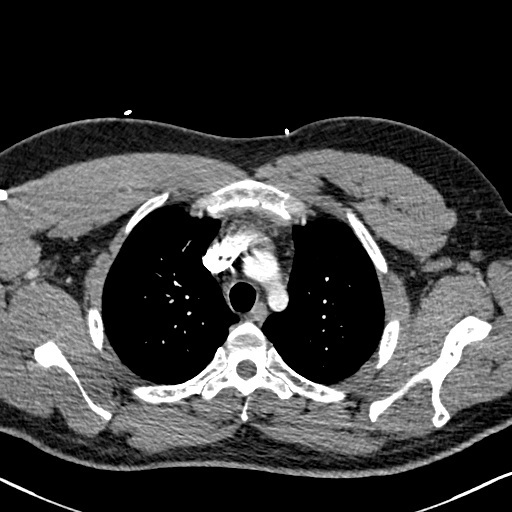
[im 193/242  lung]
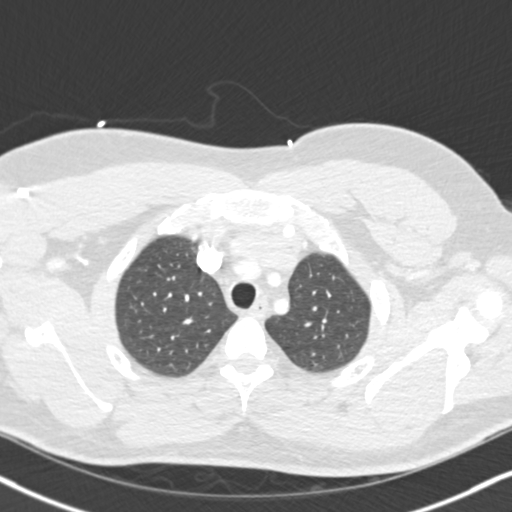
[im 205/242  mediastinal]
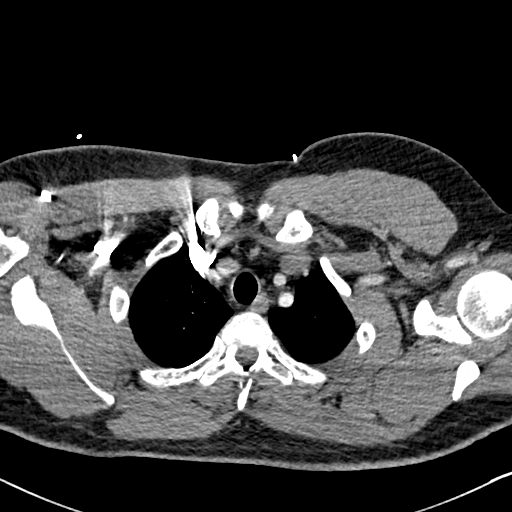
[im 217/242  lung]
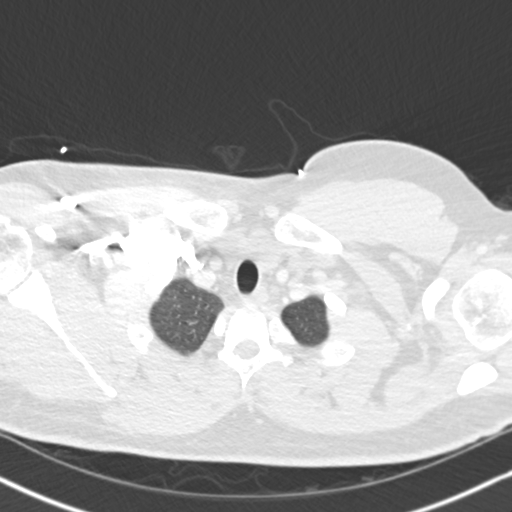
[im 229/242  mediastinal]
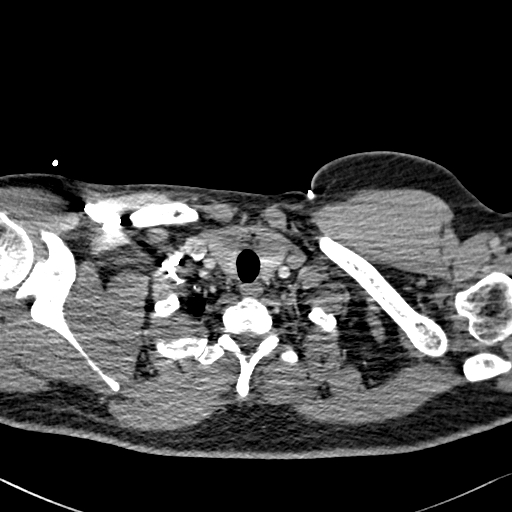

[Series 9: pe 2.0 coronal · coronal · 0.49mm/px · 1 of 143 slices shown]
[im 72/143  mediastinal]
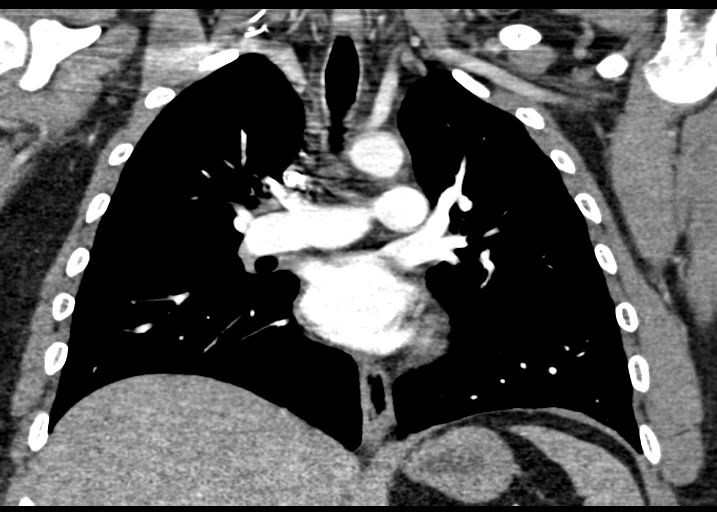

[19 of 36 positions shown; findings below may reference images not displayed]

FINDINGS: The chest wall is unremarkable. The bony thorax is intact. No acute
bony findings or destructive bony changes. No spinal canal
compromise.

The heart is normal in size. No pericardial effusion. No mediastinal
or hilar mass or adenopathy. The aorta is normal in caliber. No
dissection. The esophagus is grossly normal.

The pulmonary arterial tree is fairly well opacified. No filling
defects to suggest pulmonary emboli.

Examination of the lung parenchyma demonstrates minimal dependent
atelectasis but no infiltrates, edema or effusions. No worrisome
pulmonary lesions. Tracheobronchial tree is unremarkable.

The upper abdomen is unremarkable.

Review of the MIP images confirms the above findings.
IMPRESSION: No CT findings for pulmonary embolism.

No acute pulmonary findings.

## 2017-07-21 IMAGING — DX DG FOOT COMPLETE 3+V*L*
3 series · 3 of 3 positions shown · non-contrast
Comparison: None.

CLINICAL DATA: Bilateral foot pain.  Worse at work.

EXAM:
LEFT FOOT - COMPLETE 3+ VIEW

[foot ap]
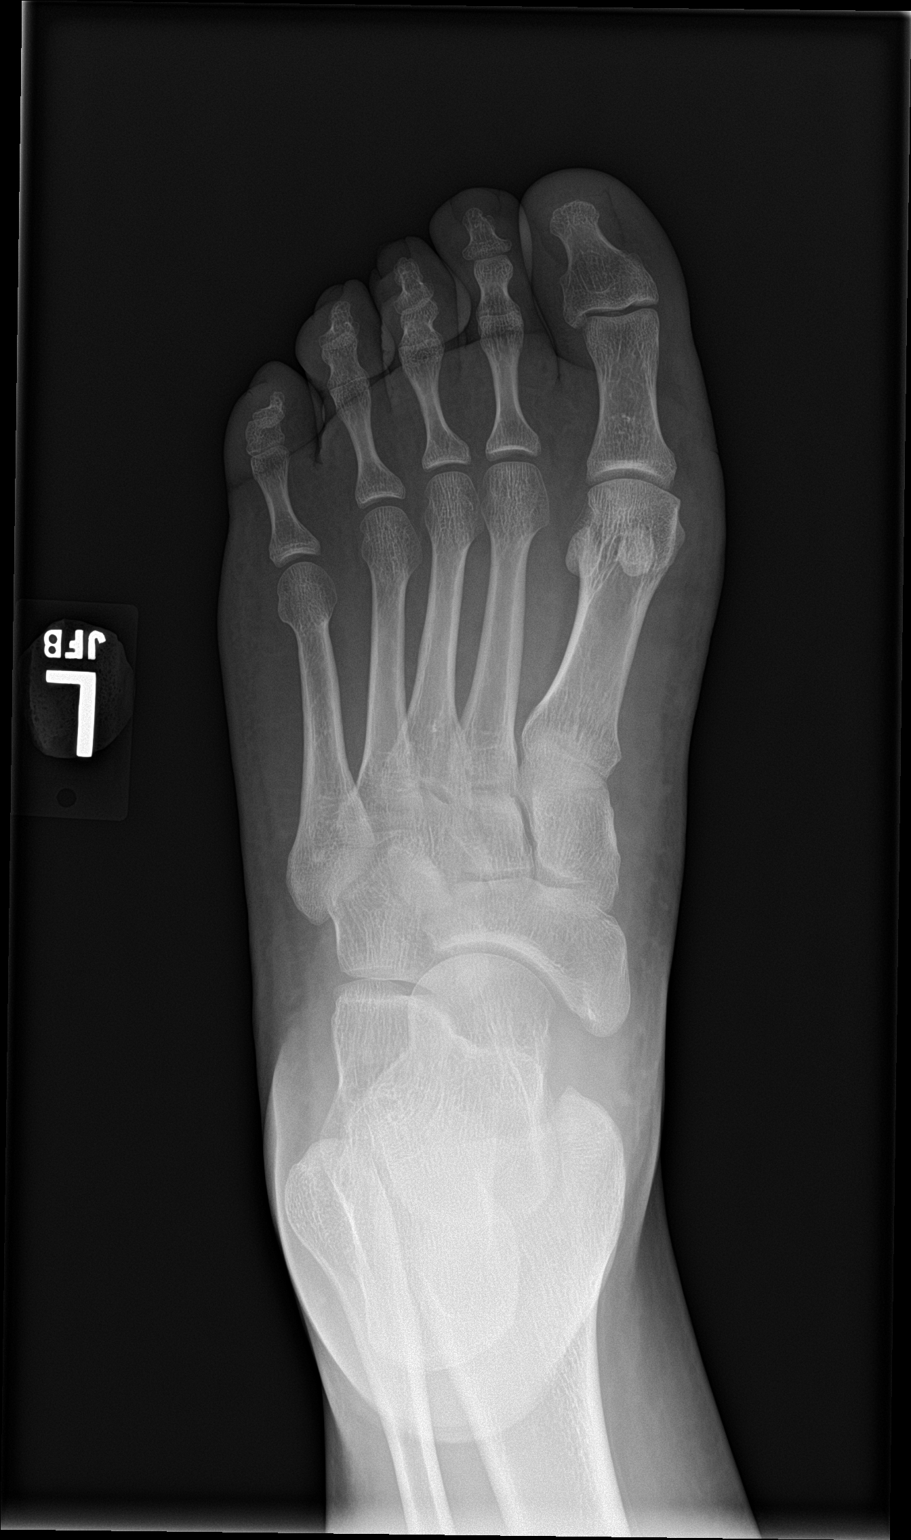

[foot obl]
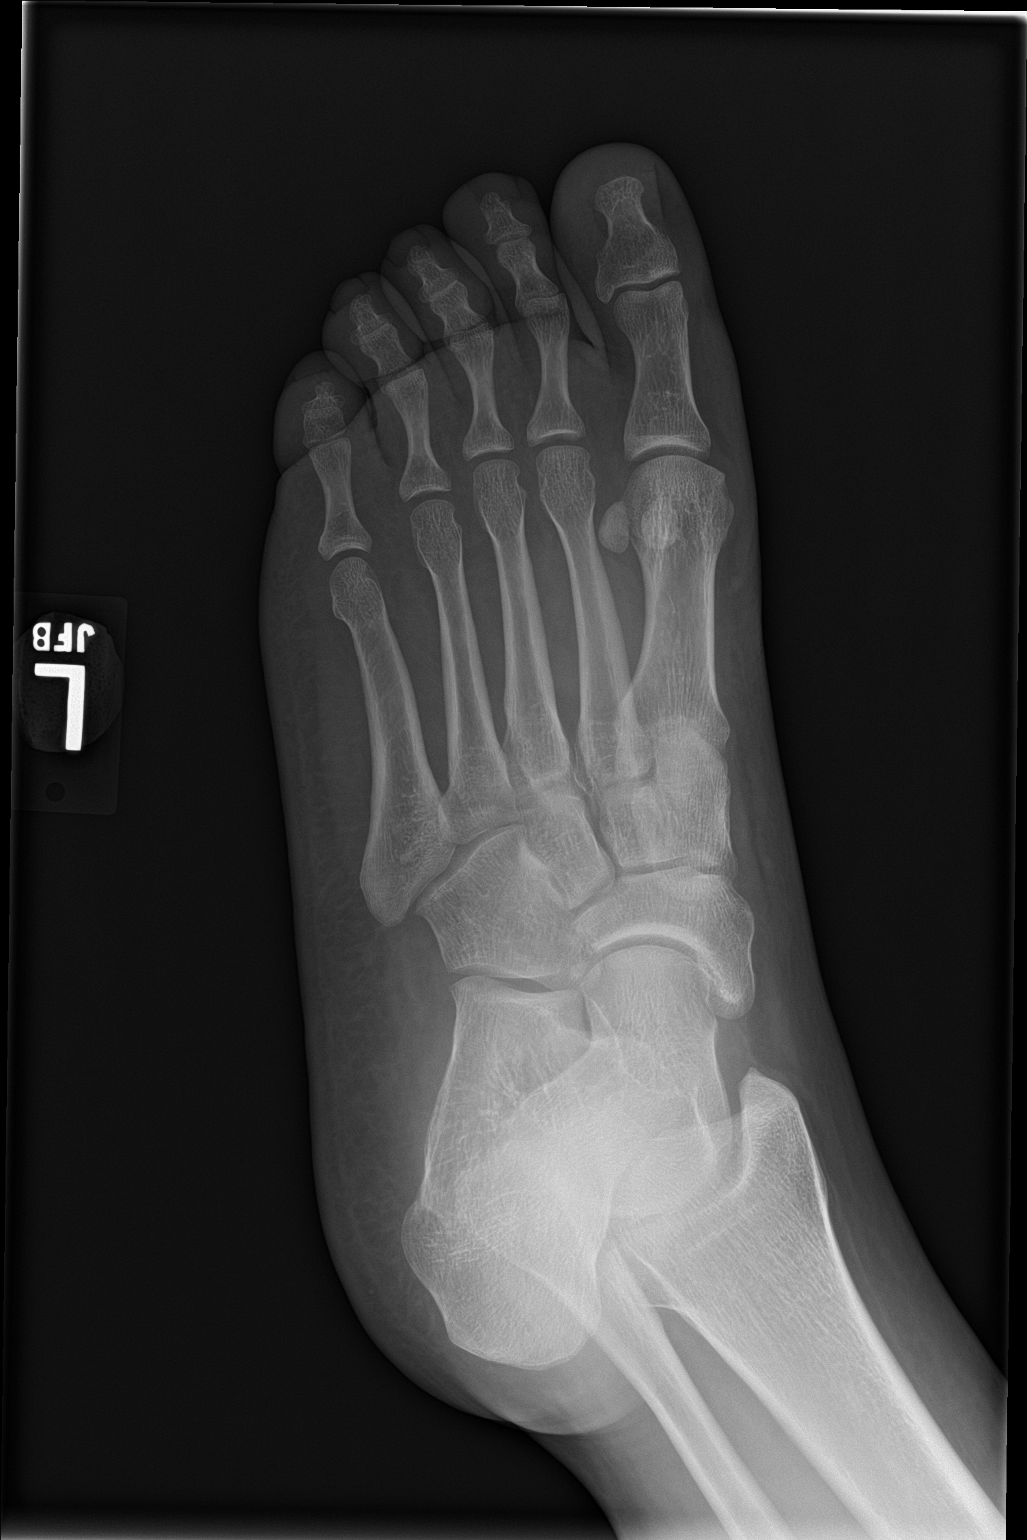

[foot lat]
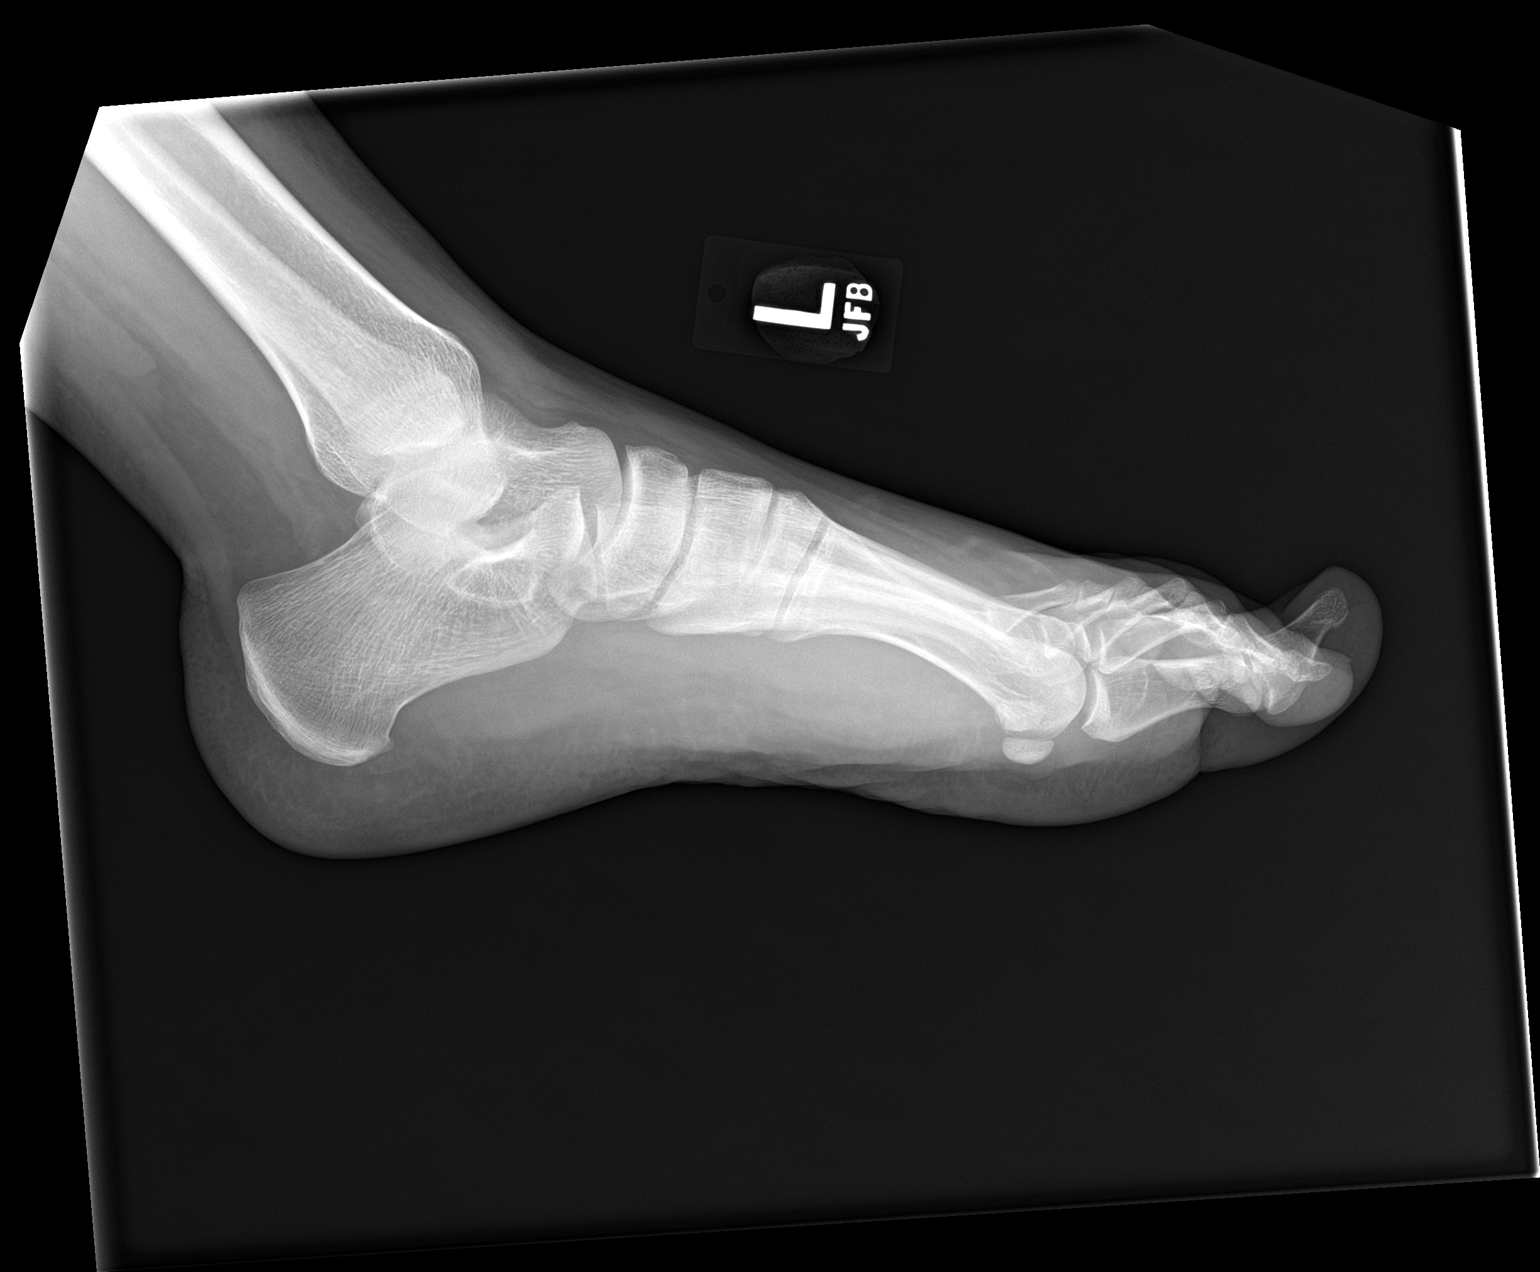

[3 of 3 positions shown; findings below may reference images not displayed]

FINDINGS: No fracture or dislocation of mid foot or forefoot. The phalanges
are normal. The calcaneus is normal. No soft tissue abnormality.
IMPRESSION: No acute osseous abnormality.

## 2019-03-25 ENCOUNTER — Emergency Department (HOSPITAL_BASED_OUTPATIENT_CLINIC_OR_DEPARTMENT_OTHER)
Admission: EM | Admit: 2019-03-25 | Discharge: 2019-03-25 | Disposition: A | Discharge status: Home / Self Care | Payer: Self-pay | Attending: Emergency Medicine | Admitting: Emergency Medicine

## 2019-03-25 ENCOUNTER — Other Ambulatory Visit: Payer: Self-pay

## 2019-03-25 ENCOUNTER — Encounter (HOSPITAL_BASED_OUTPATIENT_CLINIC_OR_DEPARTMENT_OTHER): Payer: Self-pay

## 2019-03-25 DIAGNOSIS — F17210 Nicotine dependence, cigarettes, uncomplicated: Secondary | ICD-10-CM | POA: Insufficient documentation

## 2019-03-25 DIAGNOSIS — Y9289 Other specified places as the place of occurrence of the external cause: Secondary | ICD-10-CM | POA: Insufficient documentation

## 2019-03-25 DIAGNOSIS — Y939 Activity, unspecified: Secondary | ICD-10-CM | POA: Insufficient documentation

## 2019-03-25 DIAGNOSIS — M5431 Sciatica, right side: Secondary | ICD-10-CM | POA: Insufficient documentation

## 2019-03-25 DIAGNOSIS — Y999 Unspecified external cause status: Secondary | ICD-10-CM | POA: Insufficient documentation

## 2019-03-25 DIAGNOSIS — M5432 Sciatica, left side: Secondary | ICD-10-CM | POA: Insufficient documentation

## 2019-03-25 DIAGNOSIS — X509XXA Other and unspecified overexertion or strenuous movements or postures, initial encounter: Secondary | ICD-10-CM | POA: Insufficient documentation

## 2019-03-25 MED ORDER — KETOROLAC TROMETHAMINE 15 MG/ML IJ SOLN
15.0000 mg | Freq: Once | INTRAMUSCULAR | Status: AC
  Administered 2019-03-25: 15 mg via INTRAMUSCULAR
  Filled 2019-03-25: qty 1

## 2019-03-25 MED ORDER — ACETAMINOPHEN 500 MG PO TABS
1000.0000 mg | ORAL_TABLET | Freq: Once | ORAL | Status: AC
  Administered 2019-03-25: 19:00:00 1000 mg via ORAL
  Filled 2019-03-25: qty 2

## 2019-03-25 MED ORDER — DIAZEPAM 5 MG PO TABS
5.0000 mg | ORAL_TABLET | Freq: Once | ORAL | Status: AC
  Administered 2019-03-25: 19:00:00 5 mg via ORAL
  Filled 2019-03-25: qty 1

## 2019-03-25 MED ORDER — OXYCODONE HCL 5 MG PO TABS
5.0000 mg | ORAL_TABLET | Freq: Once | ORAL | Status: AC
  Administered 2019-03-25: 5 mg via ORAL
  Filled 2019-03-25: qty 1

## 2019-03-25 NOTE — ED Provider Notes (Signed)
[facility] Provider Note   CSN: [id number] Arrival date & time: 03/25/19  1805     History   Chief Complaint Chief Complaint  Patient presents with  . Back Pain    HPI Maxwell Kelly is a 38 y.o. male.     38 yo M with a chief complaints of low back pain.  This radiates down both legs.  Is a recurrent issue for him.  States that he pulled something at work and felt some pain.  Denies overt trauma.  Denies loss of bowel or bladder denies loss of peritoneal sensation denies numbness or tingling to the legs.  Denies fever denies IV drug abuse denies recent surgery or instrumentation of his back.  The history is provided by the patient.  Back Pain Location:  Lumbar spine Quality:  Shooting and stabbing Radiates to:  L foot and R foot Pain severity:  Moderate Onset quality:  Gradual Duration:  2 days Timing:  Constant Progression:  Worsening Chronicity:  Recurrent Relieved by:  Nothing Worsened by:  Palpation, twisting, standing and ambulation Ineffective treatments:  None tried Associated symptoms: no abdominal pain, no chest pain, no fever and no headaches     History reviewed. No pertinent past medical history.  There are no active problems to display for this patient.   Past Surgical History:  Procedure Laterality Date  . WISDOM TOOTH EXTRACTION          Home Medications    Prior to Admission medications   Medication Sig Start Date End Date Taking? Authorizing Provider  ibuprofen (ADVIL,MOTRIN) 800 MG tablet Take 1 tablet (800 mg total) by mouth 3 (three) times daily. 04/29/15   Muthersbaugh, Jarrett Soho, PA-C    Family History No family history on file.  Social History Social History   Tobacco Use  . Smoking status: Current Every Day Smoker    Packs/day: 0.50    Types: Cigarettes  . Smokeless tobacco: Never Used  Substance Use Topics  . Alcohol use: No  . Drug use: No     Allergies   Patient has no known allergies.    Review of Systems Review of Systems  Constitutional: Negative for chills and fever.  HENT: Negative for congestion and facial swelling.   Eyes: Negative for discharge and visual disturbance.  Respiratory: Negative for shortness of breath.   Cardiovascular: Negative for chest pain and palpitations.  Gastrointestinal: Negative for abdominal pain, diarrhea and vomiting.  Musculoskeletal: Positive for back pain. Negative for arthralgias and myalgias.  Skin: Negative for color change and rash.  Neurological: Negative for tremors, syncope and headaches.  Psychiatric/Behavioral: Negative for confusion and dysphoric mood.     Physical Exam Updated Vital Signs BP 126/74 (BP Location: Left Arm)   Pulse 89   Temp 98 F (36.7 C) (Oral)   Resp 16   Ht 5\' 6"  (1.676 m)   Wt 113.4 kg   SpO2 100%   BMI 40.35 kg/m   Physical Exam Vitals signs and nursing note reviewed.  Constitutional:      Appearance: He is well-developed.  HENT:     Head: Normocephalic and atraumatic.  Eyes:     Pupils: Pupils are equal, round, and reactive to light.  Neck:     Musculoskeletal: Normal range of motion and neck supple.     Vascular: No JVD.  Cardiovascular:     Rate and Rhythm: Normal rate and regular rhythm.     Heart sounds: No murmur. No friction rub.  No gallop.   Pulmonary:     Effort: No respiratory distress.     Breath sounds: No wheezing.  Abdominal:     General: There is no distension.     Tenderness: There is no guarding or rebound.  Musculoskeletal: Normal range of motion.        General: Tenderness present.     Comments: Mild tenderness worse at the right SI joint.  Pulse motor and sensation is intact to bilateral lower extremities.  Negative straight leg raise test bilaterally.  Reflexes are 2+ and equal bilaterally.  No clonus.  Skin:    Coloration: Skin is not pale.     Findings: No rash.  Neurological:     Mental Status: He is alert and oriented to person, place, and time.   Psychiatric:        Behavior: Behavior normal.      ED Treatments / Results  Labs (all labs ordered are listed, but only abnormal results are displayed) Labs Reviewed - No data to display  EKG None  Radiology No results found.  Procedures Procedures (including critical care time)  Medications Ordered in ED Medications  acetaminophen (TYLENOL) tablet 1,000 mg (has no administration in time range)  ketorolac (TORADOL) 15 MG/ML injection 15 mg (has no administration in time range)  oxyCODONE (Oxy IR/ROXICODONE) immediate release tablet 5 mg (has no administration in time range)  diazepam (VALIUM) tablet 5 mg (has no administration in time range)     Initial Impression / Assessment and Plan / ED Course  I have reviewed the triage vital signs and the nursing notes.  Pertinent labs & imaging results that were available during my care of the patient were reviewed by me and considered in my medical decision making (see chart for details).        38 yo M with a chief complaints of low back pain that radiates down the leg.  This is a recurrent issue for him.  Occurs bilaterally.  Has had this problem in the past.  No trauma other than bilateral radiation no red flags.  Benign exam.  Ambulatory.  Will treat symptomatically here.  PCP follow-up.  7:12 PM:  I have discussed the diagnosis/risks/treatment options with the patient and believe the pt to be eligible for discharge home to follow-up with PCP. We also discussed returning to the ED immediately if new or worsening sx occur. We discussed the sx which are most concerning (e.g., sudden worsening pain, fever, inability to tolerate by mouth, cauda equina s/sx) that necessitate immediate return. Medications administered to the patient during their visit and any new prescriptions provided to the patient are listed below.  Medications given during this visit Medications  acetaminophen (TYLENOL) tablet 1,000 mg (has no administration in  time range)  ketorolac (TORADOL) 15 MG/ML injection 15 mg (has no administration in time range)  oxyCODONE (Oxy IR/ROXICODONE) immediate release tablet 5 mg (has no administration in time range)  diazepam (VALIUM) tablet 5 mg (has no administration in time range)     The patient appears reasonably screen and/or stabilized for discharge and I doubt any other medical condition or other [facility] requiring further screening, evaluation, or treatment in the ED at this time prior to discharge.    Final Clinical Impressions(s) / ED Diagnoses   Final diagnoses:  Bilateral sciatica    ED Discharge Orders    None       Melene Plan, DO 03/25/19 1912

## 2019-03-25 NOTE — ED Triage Notes (Signed)
Pt c/o lower back radiating down both LE-started 2 days ago-denies injury- feels like sciatica-NAD-steady slow gait

## 2019-03-25 NOTE — Discharge Instructions (Signed)

## 2019-05-30 ENCOUNTER — Emergency Department (HOSPITAL_BASED_OUTPATIENT_CLINIC_OR_DEPARTMENT_OTHER)
Admission: EM | Admit: 2019-05-30 | Discharge: 2019-05-30 | Disposition: A | Discharge status: Home / Self Care | Payer: 59 | Attending: Emergency Medicine | Admitting: Emergency Medicine

## 2019-05-30 ENCOUNTER — Encounter (HOSPITAL_BASED_OUTPATIENT_CLINIC_OR_DEPARTMENT_OTHER): Payer: Self-pay | Admitting: *Deleted

## 2019-05-30 ENCOUNTER — Other Ambulatory Visit: Payer: Self-pay

## 2019-05-30 DIAGNOSIS — F17210 Nicotine dependence, cigarettes, uncomplicated: Secondary | ICD-10-CM | POA: Diagnosis not present

## 2019-05-30 DIAGNOSIS — M5441 Lumbago with sciatica, right side: Secondary | ICD-10-CM

## 2019-05-30 DIAGNOSIS — Z79899 Other long term (current) drug therapy: Secondary | ICD-10-CM | POA: Insufficient documentation

## 2019-05-30 DIAGNOSIS — M545 Low back pain: Secondary | ICD-10-CM | POA: Diagnosis present

## 2019-05-30 HISTORY — DX: Dorsalgia, unspecified: M54.9

## 2019-05-30 MED ORDER — METHOCARBAMOL 500 MG PO TABS: 500.0000 mg | ORAL_TABLET | Freq: Every evening | ORAL | 0 refills | Status: DC | PRN

## 2019-05-30 MED ORDER — PREDNISONE 10 MG (21) PO TBPK: ORAL_TABLET | Freq: Every day | ORAL | 0 refills | Status: DC

## 2019-05-30 MED ORDER — LIDOCAINE 5 % EX PTCH: 1.0000 | MEDICATED_PATCH | CUTANEOUS | 0 refills | Status: DC

## 2019-05-30 MED ORDER — KETOROLAC TROMETHAMINE 15 MG/ML IJ SOLN
15.0000 mg | Freq: Once | INTRAMUSCULAR | Status: AC
  Administered 2019-05-30: 15 mg via INTRAMUSCULAR
  Filled 2019-05-30: qty 1

## 2019-05-30 NOTE — ED Notes (Signed)
Patient verbalizes understanding of discharge instructions. Opportunity for questioning and answers were provided. Armband removed by staff, pt discharged from ED.  

## 2019-05-30 NOTE — ED Provider Notes (Signed)
[facility] Provider Note   CSN: [id number] Arrival date & time: 05/30/19  1909     History Chief Complaint  Patient presents with  . Back Pain    Maxwell Kelly is a 39 y.o. male presenting for evaluation of back pain.  Patient states 2 days ago he was walking when he felt a pop in his back.  He then started to have right-sided back pain which radiates down his right leg.  He has a history of similar.  He denies fall, trauma, or injury.  He denies fevers, chills, nausea, vomiting, urinary symptoms, loss of bowel bladder control, numbness, tingling, history of cancer, history of IVDU.  Patient states his symptoms improved last time with a shot given in the ED.  He does not for primary care doctor, has not followed up with orthopedics about this.  He has no other medical problems, takes medications daily.  Patient states he has been taking Excedrin for pain, this has not been helping.  He has not taken anything else.  HPI     Past Medical History:  Diagnosis Date  . Back pain     There are no problems to display for this patient.   Past Surgical History:  Procedure Laterality Date  . WISDOM TOOTH EXTRACTION         No family history on file.  Social History   Tobacco Use  . Smoking status: Current Every Day Smoker    Packs/day: 0.50    Types: Cigarettes  . Smokeless tobacco: Never Used  Substance Use Topics  . Alcohol use: No  . Drug use: No    Home Medications Prior to Admission medications   Medication Sig Start Date End Date Taking? Authorizing Provider  ibuprofen (ADVIL,MOTRIN) 800 MG tablet Take 1 tablet (800 mg total) by mouth 3 (three) times daily. 04/29/15   Muthersbaugh, Jarrett Soho, PA-C  lidocaine (LIDODERM) 5 % Place 1 patch onto the skin daily. Remove & Discard patch within 12 hours or as directed by MD 05/30/19   Seferino Oscar, PA-C  methocarbamol (ROBAXIN) 500 MG tablet Take 1 tablet (500 mg total) by mouth at bedtime as  needed. 05/30/19   Mell Guia, PA-C  predniSONE (STERAPRED UNI-PAK 21 TAB) 10 MG (21) TBPK tablet Take by mouth daily. Take 6 tabs by mouth daily  for 2 days, then 5 tabs for 2 days, then 4 tabs for 2 days, then 3 tabs for 2 days, 2 tabs for 2 days, then 1 tab by mouth daily for 2 days 05/30/19   Kalib Bhagat, PA-C    Allergies    Patient has no known allergies.  Review of Systems   Review of Systems  Musculoskeletal: Positive for back pain.  All other systems reviewed and are negative.   Physical Exam Updated Vital Signs BP (!) 124/92 (BP Location: Right Arm)   Pulse 81   Temp 98.9 F (37.2 C) (Oral)   Resp 20   Ht 5\' 8"  (1.727 m)   Wt 113.4 kg   SpO2 98%   BMI 38.01 kg/m   Physical Exam Vitals and nursing note reviewed.  Constitutional:      General: He is not in acute distress.    Appearance: He is well-developed.     Comments: Resting comfortably in the chair no acute distress  HENT:     Head: Normocephalic and atraumatic.  Eyes:     Conjunctiva/sclera: Conjunctivae normal.     Pupils: Pupils are equal, round,  and reactive to light.  Cardiovascular:     Rate and Rhythm: Normal rate and regular rhythm.     Pulses: Normal pulses.  Pulmonary:     Effort: Pulmonary effort is normal. No respiratory distress.     Breath sounds: Normal breath sounds. No wheezing.  Abdominal:     General: There is no distension.     Palpations: Abdomen is soft.     Tenderness: There is no abdominal tenderness. There is no guarding.  Musculoskeletal:        General: Normal range of motion.     Cervical back: Normal range of motion and neck supple.     Comments: Tenderness palpation of right low back musculature.  Mild tenderness palpation of the midline spine, but no step-offs or deformities.  Tenderness palpation over the right buttock.  Positive straight leg raise.  Pedal pulses intact bilaterally.  Patellar reflexes equal bilaterally.  Patient is ambulatory.  Skin:     General: Skin is warm and dry.     Capillary Refill: Capillary refill takes less than 2 seconds.  Neurological:     Mental Status: He is alert and oriented to person, place, and time.     ED Results / Procedures / Treatments   Labs (all labs ordered are listed, but only abnormal results are displayed) Labs Reviewed - No data to display  EKG None  Radiology No results found.  Procedures Procedures (including critical care time)  Medications Ordered in ED Medications  ketorolac (TORADOL) 15 MG/ML injection 15 mg (15 mg Intramuscular Given 05/30/19 2022)    ED Course  I have reviewed the triage vital signs and the nursing notes.  Pertinent labs & imaging results that were available during my care of the patient were reviewed by me and considered in my medical decision making (see chart for details).    MDM Rules/Calculators/A&P                      Patient presenting for evaluation of low back pain.  Physical exam reassuring, neurovascularly intact.  No red flags for back pain.  Pain is reproducible with palpation of the musculature.  Likely musculoskeletal/sciatica.  Doubt fracture, I do not believe x-rays will be beneficial.  Doubt vertebral injury, infection, spinal cord compression, myelopathy, or cauda equina syndrome.  Will treat symptomatically with prednisone, muscle relaxers, lidoderm, muscle creams.  Patient given information to follow-up with primary care.  At this time, patient appears safe for discharge.  Return precautions given.  Patient states he understands and agrees to plan.  Final Clinical Impression(s) / ED Diagnoses Final diagnoses:  Acute right-sided low back pain with right-sided sciatica    Rx / DC Orders ED Discharge Orders         Ordered    predniSONE (STERAPRED UNI-PAK 21 TAB) 10 MG (21) TBPK tablet  Daily     05/30/19 2011    methocarbamol (ROBAXIN) 500 MG tablet  At bedtime PRN     05/30/19 2011    lidocaine (LIDODERM) 5 %  Every 24 hours      05/30/19 2011           Alveria Apley, PA-C 05/30/19 2027    Jacalyn Lefevre, MD 05/30/19 2130

## 2019-05-30 NOTE — ED Notes (Signed)
ED Provider at bedside. 

## 2019-05-30 NOTE — ED Triage Notes (Signed)
Pt reports back pain since Thursday. Denies known injury. Reports he was walking and felt o pop. Has had similar episodes in the past

## 2019-05-30 NOTE — Discharge Instructions (Signed)
Take prednisone as prescribed.  Do not take other anti-inflammatories at the same time open (Advil, Motrin, ibuprofen, aleve). You may supplement with Tylenol if you need further pain control. Use Robaxin as needed for muscle stiffness or soreness. Have caution, as this may make you tired or groggy. Do not driver or operate heavy machinery while taking this medication.  Use lidocaine patches for pain control. Use muscle creams (bengay, icy hot, salonpas) as needed for pain.  Do the stretches in the paperwork.  Follow up with a primary care doctor if pain is not improving with this treatment. You may call the office below or the number in the paperwork to help you set up a primary care doctor.  Return to the ER if you develop high fevers, numbness, loss of bowel or bladder control, or any new or concerning symptoms.

## 2019-09-10 ENCOUNTER — Other Ambulatory Visit: Payer: Self-pay

## 2019-09-10 ENCOUNTER — Emergency Department (HOSPITAL_BASED_OUTPATIENT_CLINIC_OR_DEPARTMENT_OTHER)
Admission: EM | Admit: 2019-09-10 | Discharge: 2019-09-10 | Disposition: A | Discharge status: Home / Self Care | Payer: 59 | Attending: Emergency Medicine | Admitting: Emergency Medicine

## 2019-09-10 ENCOUNTER — Encounter (HOSPITAL_BASED_OUTPATIENT_CLINIC_OR_DEPARTMENT_OTHER): Payer: Self-pay | Admitting: Emergency Medicine

## 2019-09-10 DIAGNOSIS — M5442 Lumbago with sciatica, left side: Secondary | ICD-10-CM | POA: Diagnosis not present

## 2019-09-10 DIAGNOSIS — F17210 Nicotine dependence, cigarettes, uncomplicated: Secondary | ICD-10-CM | POA: Insufficient documentation

## 2019-09-10 DIAGNOSIS — M545 Low back pain: Secondary | ICD-10-CM | POA: Diagnosis present

## 2019-09-10 MED ORDER — HYDROCODONE-ACETAMINOPHEN 5-325 MG PO TABS: 1.0000 | ORAL_TABLET | Freq: Four times a day (QID) | ORAL | 0 refills | Status: AC | PRN

## 2019-09-10 MED ORDER — IBUPROFEN 800 MG PO TABS
800.0000 mg | ORAL_TABLET | Freq: Three times a day (TID) | ORAL | 0 refills | Status: AC
Start: 2019-09-10 — End: ?

## 2019-09-10 MED ORDER — PREDNISONE 20 MG PO TABS: ORAL_TABLET | ORAL | 0 refills | Status: AC

## 2019-09-10 MED ORDER — METHOCARBAMOL 500 MG PO TABS: 500.0000 mg | ORAL_TABLET | Freq: Every evening | ORAL | 0 refills | Status: AC | PRN

## 2019-09-10 MED ORDER — KETOROLAC TROMETHAMINE 60 MG/2ML IM SOLN
60.0000 mg | Freq: Once | INTRAMUSCULAR | Status: AC
  Administered 2019-09-10: 60 mg via INTRAMUSCULAR
  Filled 2019-09-10: qty 2

## 2019-09-10 MED ORDER — PREDNISONE 10 MG PO TABS
60.0000 mg | ORAL_TABLET | Freq: Once | ORAL | Status: AC
  Administered 2019-09-10: 60 mg via ORAL
  Filled 2019-09-10: qty 1

## 2019-09-10 MED ORDER — LIDOCAINE 5 % EX PTCH: 1.0000 | MEDICATED_PATCH | CUTANEOUS | 0 refills | Status: AC

## 2019-09-10 NOTE — ED Provider Notes (Signed)
[facility] EMERGENCY DEPARTMENT Provider Note   CSN: [id number] Arrival date & time: 09/10/19  1628     History Chief Complaint  Patient presents with  . Back Pain    Maxwell Kelly is a 39 y.o. male who presented with back pain.  Patient states that yesterday he he bent over the wrong way and felt sudden onset of left low back pain that radiates down the left leg.  Patient states that he did not fall or injure himself.  He had some muscle spasms associated with it.  He denies any numbness or weakness or trouble urinating.  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  He has a history of back pain but did not follow-up with a spine doctor or got MRI previously.  Patient was recently seen in the ED about 3 months ago for similar problems.  He finished a course of prednisone and Robaxin and lidocaine with good improvement.  The history is provided by the patient.       Past Medical History:  Diagnosis Date  . Back pain     There are no problems to display for this patient.   Past Surgical History:  Procedure Laterality Date  . WISDOM TOOTH EXTRACTION         No family history on file.  Social History   Tobacco Use  . Smoking status: Current Every Day Smoker    Packs/day: 0.50    Types: Cigarettes  . Smokeless tobacco: Never Used  Substance Use Topics  . Alcohol use: No  . Drug use: No    Home Medications Prior to Admission medications   Medication Sig Start Date End Date Taking? Authorizing Provider  ibuprofen (ADVIL,MOTRIN) 800 MG tablet Take 1 tablet (800 mg total) by mouth 3 (three) times daily. 04/29/15   Muthersbaugh, Dahlia Client, PA-C  lidocaine (LIDODERM) 5 % Place 1 patch onto the skin daily. Remove & Discard patch within 12 hours or as directed by MD 05/30/19   Caccavale, Sophia, PA-C  methocarbamol (ROBAXIN) 500 MG tablet Take 1 tablet (500 mg total) by mouth at bedtime as needed. 05/30/19   Caccavale, Sophia, PA-C    predniSONE (STERAPRED UNI-PAK 21 TAB) 10 MG (21) TBPK tablet Take by mouth daily. Take 6 tabs by mouth daily  for 2 days, then 5 tabs for 2 days, then 4 tabs for 2 days, then 3 tabs for 2 days, 2 tabs for 2 days, then 1 tab by mouth daily for 2 days 05/30/19   Caccavale, Sophia, PA-C    Allergies    Patient has no known allergies.  Review of Systems   Review of Systems  Musculoskeletal: Positive for back pain.  All other systems reviewed and are negative.   Physical Exam Updated Vital Signs BP 127/76 (BP Location: Right Arm)   Pulse 75   Temp 98 F (36.7 C) (Oral)   Resp 18   Ht 5\' 8"  (1.727 m)   Wt 112.9 kg   SpO2 99%   BMI 37.86 kg/m   Physical Exam Vitals and nursing note reviewed.  HENT:     Head: Normocephalic.     Nose: Nose normal.     Mouth/Throat:     Mouth: Mucous membranes are moist.  Eyes:     Extraocular Movements: Extraocular movements intact.     Pupils: Pupils are equal, round, and reactive to light.  Cardiovascular:     Rate and Rhythm:  Normal rate and regular rhythm.     Pulses: Normal pulses.  Pulmonary:     Effort: Pulmonary effort is normal.  Abdominal:     General: Abdomen is flat.     Palpations: Abdomen is soft.  Musculoskeletal:     Cervical back: Normal range of motion.     Comments: + L paralumbar tenderness, no saddle anesthesia   Skin:    General: Skin is warm.     Capillary Refill: Capillary refill takes less than 2 seconds.  Neurological:     General: No focal deficit present.     Mental Status: He is alert and oriented to person, place, and time.     Comments: No saddle anesthesia, nl strength and sensation throughout, nl reflexes   Psychiatric:        Mood and Affect: Mood normal.     ED Results / Procedures / Treatments   Labs (all labs ordered are listed, but only abnormal results are displayed) Labs Reviewed - No data to display  EKG None  Radiology No results found.  Procedures Procedures (including critical  care time)  Medications Ordered in ED Medications  ketorolac (TORADOL) injection 60 mg (60 mg Intramuscular Given 09/10/19 1702)  predniSONE (DELTASONE) tablet 60 mg (60 mg Oral Given 09/10/19 1700)    ED Course  I have reviewed the triage vital signs and the nursing notes.  Pertinent labs & imaging results that were available during my care of the patient were reviewed by me and considered in my medical decision making (see chart for details).    MDM Rules/Calculators/A&P                      Maxwell Kelly is a 39 y.o. male here with back pain.  No a saddle anesthesia and neurovascular intact in the lower extremities.  This is a chronic problem that got acutely worse.  Will discharge home with Lidoderm, NSAIDs, prednisone, Robaxin, short course of pain meds.  Will refer to back doctor for outpatient MRI.  Final Clinical Impression(s) / ED Diagnoses Final diagnoses:  None    Rx / DC Orders ED Discharge Orders    None       Drenda Freeze, MD 09/10/19 [phone]

## 2019-09-10 NOTE — ED Triage Notes (Signed)
C/o L low back pain since yesterday. Pain radiates down leg.

## 2019-09-10 NOTE — Discharge Instructions (Signed)
Take prednisone as prescribed   Take motrin for pain   Take robaxin for muscle spasms   Take vicodin for severe pain. Do NOT drive with it   Use lidoderm patch   See your doctor. Follow up with spine doctor   Return to ER if you have worse back pain, numbness, weakness

## 2019-09-10 NOTE — ED Notes (Signed)
ED Provider at bedside. 

## 2019-12-18 ENCOUNTER — Other Ambulatory Visit: Payer: Self-pay

## 2019-12-18 ENCOUNTER — Emergency Department (HOSPITAL_BASED_OUTPATIENT_CLINIC_OR_DEPARTMENT_OTHER)
Admission: EM | Admit: 2019-12-18 | Discharge: 2019-12-18 | Disposition: A | Discharge status: Home / Self Care | Payer: 59 | Attending: Emergency Medicine | Admitting: Emergency Medicine

## 2019-12-18 ENCOUNTER — Encounter (HOSPITAL_BASED_OUTPATIENT_CLINIC_OR_DEPARTMENT_OTHER): Payer: Self-pay | Admitting: Emergency Medicine

## 2019-12-18 DIAGNOSIS — F17210 Nicotine dependence, cigarettes, uncomplicated: Secondary | ICD-10-CM | POA: Diagnosis not present

## 2019-12-18 DIAGNOSIS — M5442 Lumbago with sciatica, left side: Secondary | ICD-10-CM | POA: Diagnosis not present

## 2019-12-18 DIAGNOSIS — M5432 Sciatica, left side: Secondary | ICD-10-CM

## 2019-12-18 MED ORDER — METHOCARBAMOL 500 MG PO TABS: 500.0000 mg | ORAL_TABLET | Freq: Two times a day (BID) | ORAL | 0 refills | Status: AC

## 2019-12-18 MED ORDER — PREDNISONE 10 MG (21) PO TBPK
ORAL_TABLET | Freq: Every day | ORAL | 0 refills | Status: AC
Start: 2019-12-18 — End: ?

## 2019-12-18 MED ORDER — LIDOCAINE 5 % EX PTCH: 1.0000 | MEDICATED_PATCH | CUTANEOUS | 0 refills | Status: AC

## 2019-12-18 NOTE — ED Triage Notes (Signed)
Pt report sciatic nerve pain onset two days. Reports any type of movement elevates pain to 10/10, states as long as he doesn't move he has no pain. Denies loss of control of bowel or bladder.

## 2019-12-18 NOTE — Discharge Instructions (Signed)
Your history and exam today are consistent with flareup of your recurrent low back and sciatic type pain.  As you have had no acute injury, we agreed to hold on new imaging but we do feel need to follow-up with your spine orthopedics team.  We will again use the medications you had success with with the Lidoderm, muscle relaxant with Robaxin, and the steroid taper.  Please use these medications as well as your home pain medication and rest.  Please follow-up with them.  If any symptoms change or worsen or you develop new symptoms such as numbness, tingling, weakness, or loss of bowel or bladder control, please return to the nearest emergency department immediately.

## 2019-12-18 NOTE — ED Provider Notes (Signed)
[facility] EMERGENCY DEPARTMENT Provider Note   CSN: [id number] Arrival date & time: 12/18/19  4656     History Chief Complaint  Patient presents with  . Back Pain    Maxwell Kelly is a 39 y.o. male.  The history is provided by the patient and medical records. No language interpreter was used.  Back Pain Location:  Lumbar spine Quality:  Shooting and stabbing Radiates to:  L posterior upper leg Pain severity:  Severe Pain is:  Unable to specify Onset quality:  Gradual Duration:  2 days Timing:  Constant Progression:  Waxing and waning Chronicity:  Recurrent Context: not recent injury   Relieved by:  Nothing Worsened by:  Twisting and movement Ineffective treatments:  None tried Associated symptoms: leg pain   Associated symptoms: no abdominal pain, no bladder incontinence, no bowel incontinence, no chest pain, no dysuria, no fever, no headaches, no numbness, no paresthesias, no perianal numbness, no tingling and no weakness        Past Medical History:  Diagnosis Date  . Back pain     There are no problems to display for this patient.   Past Surgical History:  Procedure Laterality Date  . WISDOM TOOTH EXTRACTION         History reviewed. No pertinent family history.  Social History   Tobacco Use  . Smoking status: Current Every Day Smoker    Packs/day: 0.50    Types: Cigarettes  . Smokeless tobacco: Never Used  Vaping Use  . Vaping Use: Every day  Substance Use Topics  . Alcohol use: No  . Drug use: No    Home Medications Prior to Admission medications   Medication Sig Start Date End Date Taking? Authorizing Provider  HYDROcodone-acetaminophen (NORCO/VICODIN) 5-325 MG tablet Take 1 tablet by mouth every 6 (six) hours as needed. 09/10/19   Charlynne Pander, MD  ibuprofen (ADVIL) 800 MG tablet Take 1 tablet (800 mg total) by mouth 3 (three) times daily. 09/10/19   Charlynne Pander, MD  lidocaine (LIDODERM) 5 % Place 1 patch onto  the skin daily. Remove & Discard patch within 12 hours or as directed by MD 09/10/19   Charlynne Pander, MD  methocarbamol (ROBAXIN) 500 MG tablet Take 1 tablet (500 mg total) by mouth at bedtime as needed. 09/10/19   Charlynne Pander, MD  predniSONE (DELTASONE) 20 MG tablet Take 60 mg daily x 2 days then 40 mg daily x 2 days then 20 mg daily x 2 days 09/10/19   Charlynne Pander, MD    Allergies    Patient has no known allergies.  Review of Systems   Review of Systems  Constitutional: Negative for chills, fatigue and fever.  HENT: Negative for congestion.   Eyes: Negative for visual disturbance.  Respiratory: Negative for cough, chest tightness and shortness of breath.   Cardiovascular: Negative for chest pain and palpitations.  Gastrointestinal: Negative for abdominal pain, bowel incontinence, constipation, diarrhea and nausea.  Genitourinary: Negative for bladder incontinence, discharge, dysuria, flank pain, frequency, penile pain, penile swelling, scrotal swelling and testicular pain.  Musculoskeletal: Positive for back pain. Negative for neck pain and neck stiffness.  Skin: Negative for rash and wound.  Neurological: Negative for dizziness, tingling, seizures, weakness, light-headedness, numbness, headaches and paresthesias.  Psychiatric/Behavioral: Negative for agitation and confusion.  All other systems reviewed and are negative.   Physical Exam Updated Vital Signs BP 101/72 (BP Location: Right Arm)   Pulse 70   Temp 98.5  F (36.9 C) (Oral)   Resp 16   Wt (!) 111.9 kg   SpO2 99%   BMI 37.51 kg/m   Physical Exam Vitals and nursing note reviewed.  Constitutional:      General: He is not in acute distress.    Appearance: He is well-developed. He is not ill-appearing, toxic-appearing or diaphoretic.  HENT:     Head: Normocephalic and atraumatic.     Nose: No congestion or rhinorrhea.     Mouth/Throat:     Mouth: Mucous membranes are moist.     Pharynx: No  oropharyngeal exudate or posterior oropharyngeal erythema.  Eyes:     Extraocular Movements: Extraocular movements intact.     Conjunctiva/sclera: Conjunctivae normal.     Pupils: Pupils are equal, round, and reactive to light.  Cardiovascular:     Rate and Rhythm: Normal rate and regular rhythm.     Heart sounds: No murmur heard.   Pulmonary:     Effort: Pulmonary effort is normal. No respiratory distress.     Breath sounds: Normal breath sounds.  Abdominal:     General: Abdomen is flat.     Palpations: Abdomen is soft.     Tenderness: There is no abdominal tenderness. There is no right CVA tenderness, left CVA tenderness, guarding or rebound.  Musculoskeletal:        General: Tenderness present.     Right lower leg: No edema.     Left lower leg: No edema.  Skin:    General: Skin is warm and dry.     Findings: No erythema.  Neurological:     General: No focal deficit present.     Mental Status: He is alert and oriented to person, place, and time.     Sensory: No sensory deficit.     Motor: No weakness.     Gait: Gait normal.     Comments: Normal sensation, strength, pulses in lower extremities.  Straight leg raise positive on the left.  Tenderness in the left low back.  Psychiatric:        Mood and Affect: Mood normal.     ED Results / Procedures / Treatments   Labs (all labs ordered are listed, but only abnormal results are displayed) Labs Reviewed - No data to display  EKG None  Radiology No results found.  Procedures Procedures (including critical care time)  Medications Ordered in ED Medications - No data to display  ED Course  I have reviewed the triage vital signs and the nursing notes.  Pertinent labs & imaging results that were available during my care of the patient were reviewed by me and considered in my medical decision making (see chart for details).    MDM Rules/Calculators/A&P                          Maxwell Kelly is a 39 y.o. 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  Patient reports that he had been managed by the week for spine orthopedics team over the last several months for recurrent left low back pain rating down his leg.  He reports that he had done so well he had discontinued his physical therapy regimen.  He reports that recently his work load increased and he is working longer shifts.  He is lifting heavy things and doing more than he was  before.  He denies any specific instances of trauma, falls, or feeling in sudden onset of symptoms but he does report of the last 2 days he has had recurrent similar pain.  He reports that last time he was given steroid taper, Robaxin, and Lidoderm which significantly helped as well as some home pain medication.  He denies any numbness, tingling, weakness of legs.  Denies any loss of bowel or bladder control.  Denies other complaints.  Denies groin pain.  On exam, patient does have tenderness in his left low back.  There is a muscle spasm palpated.  Straight leg raise was positive on the left.  Normal sensation, strength, and pulse in legs.  Normal gait.  Significant pain with palpation and movement.  Exam otherwise unremarkable lungs clear chest nontender.  Due to lack of new injury, will hold on imaging however I do feel patient is to follow-up with his orthopedic spine team.  Patient reports that he already has pain medicine at home but would like to do the same regimen of the Lidoderm patch, steroid taper, muscle relaxants.  We will give him this prescription and he will follow-up with his orthopedics team.  He understands return precautions for cauda equina or any acute or worsening symptoms.  He had other questions or concerns and was discharged with understanding of plan of care.   Final Clinical Impression(s) / ED Diagnoses Final diagnoses:  Sciatica of left side  Acute  left-sided low back pain with left-sided sciatica    Rx / DC Orders ED Discharge Orders         Ordered    lidocaine (LIDODERM) 5 %  Every 24 hours     Discontinue  Reprint     12/18/19 1008    methocarbamol (ROBAXIN) 500 MG tablet  2 times daily     Discontinue  Reprint     12/18/19 1008    predniSONE (STERAPRED UNI-PAK 21 TAB) 10 MG (21) TBPK tablet  Daily     Discontinue  Reprint     12/18/19 1008         Clinical Impression: 1. Sciatica of left side   2. Acute left-sided low back pain with left-sided sciatica     Disposition: Discharge  Condition: Good  I have discussed the results, Dx and Tx plan with the pt(& family if present). He/she/they expressed understanding and agree(s) with the plan. Discharge instructions discussed at great length. Strict return precautions discussed and pt &/or family have verbalized understanding of the instructions. No further questions at time of discharge.    New Prescriptions   LIDOCAINE (LIDODERM) 5 %    Place 1 patch onto the skin daily. Remove & Discard patch within 12 hours or as directed by MD   METHOCARBAMOL (ROBAXIN) 500 MG TABLET    Take 1 tablet (500 mg total) by mouth 2 (two) times daily.   PREDNISONE (STERAPRED UNI-PAK 21 TAB) 10 MG (21) TBPK TABLET    Take by mouth daily. Take 6 tabs by mouth daily  for 2 days, then 5 tabs for 2 days, then 4 tabs for 2 days, then 3 tabs for 2 days, 2 tabs for 2 days, then 1 tab by mouth daily for 2 days    Follow Up: Leonard SchwartzBirkedal, John P, MD [address] [place] [phone]     [facility] [address] [id number] [place] [id number] [phone]       Maxwell Kelly, Cristal Deerhristopher  J, MD 12/18/19 1015

## 2019-12-26 ENCOUNTER — Other Ambulatory Visit: Payer: Self-pay

## 2019-12-26 ENCOUNTER — Emergency Department (HOSPITAL_BASED_OUTPATIENT_CLINIC_OR_DEPARTMENT_OTHER)
Admission: EM | Admit: 2019-12-26 | Discharge: 2019-12-27 | Disposition: A | Discharge status: Home / Self Care | Payer: 59 | Attending: Emergency Medicine | Admitting: Emergency Medicine

## 2019-12-26 ENCOUNTER — Encounter (HOSPITAL_BASED_OUTPATIENT_CLINIC_OR_DEPARTMENT_OTHER): Payer: Self-pay | Admitting: Emergency Medicine

## 2019-12-26 DIAGNOSIS — Z20822 Contact with and (suspected) exposure to covid-19: Secondary | ICD-10-CM

## 2019-12-26 DIAGNOSIS — R05 Cough: Secondary | ICD-10-CM | POA: Diagnosis present

## 2019-12-26 DIAGNOSIS — U071 COVID-19: Secondary | ICD-10-CM | POA: Insufficient documentation

## 2019-12-26 DIAGNOSIS — F17210 Nicotine dependence, cigarettes, uncomplicated: Secondary | ICD-10-CM | POA: Insufficient documentation

## 2019-12-26 NOTE — ED Provider Notes (Signed)
[facility] EMERGENCY DEPARTMENT Provider Note   CSN: [id number] Arrival date & time: 12/26/19  1926     History Chief Complaint  Patient presents with  . covid symptoms    Maxwell Kelly is a 39 y.o. male here presenting with possible Covid.  He states that his kids were sick with similar symptoms and since yesterday he has been having some body aches and cough and sore throat.  He also started running low-grade temperature 99 yesterday.  Had low-grade temperature 100 today.  He went to work and was sent here for [employer].  He states that he did not receive the Covid vaccine and is otherwise healthy and has no medical problems.  He denies any underlying lung problems.  The history is provided by the patient.       Past Medical History:  Diagnosis Date  . Back pain     There are no problems to display for this patient.   Past Surgical History:  Procedure Laterality Date  . WISDOM TOOTH EXTRACTION         History reviewed. No pertinent family history.  Social History   Tobacco Use  . Smoking status: Current Every Day Smoker    Packs/day: 0.50    Types: Cigarettes  . Smokeless tobacco: Never Used  Vaping Use  . Vaping Use: Every day  Substance Use Topics  . Alcohol use: No  . Drug use: No    Home Medications Prior to Admission medications   Medication Sig Start Date End Date Taking? Authorizing Provider  HYDROcodone-acetaminophen (NORCO/VICODIN) 5-325 MG tablet Take 1 tablet by mouth every 6 (six) hours as needed. 09/10/19   Charlynne Pander, MD  ibuprofen (ADVIL) 800 MG tablet Take 1 tablet (800 mg total) by mouth 3 (three) times daily. 09/10/19   Charlynne Pander, MD  lidocaine (LIDODERM) 5 % Place 1 patch onto the skin daily. Remove & Discard patch within 12 hours or as directed by MD 09/10/19   Charlynne Pander, MD  lidocaine (LIDODERM) 5 % Place 1 patch onto the skin daily. Remove & Discard patch within 12 hours or as directed by MD 12/18/19    Tegeler, Canary Brim, MD  methocarbamol (ROBAXIN) 500 MG tablet Take 1 tablet (500 mg total) by mouth at bedtime as needed. 09/10/19   Charlynne Pander, MD  methocarbamol (ROBAXIN) 500 MG tablet Take 1 tablet (500 mg total) by mouth 2 (two) times daily. 12/18/19   Tegeler, Canary Brim, MD  predniSONE (DELTASONE) 20 MG tablet Take 60 mg daily x 2 days then 40 mg daily x 2 days then 20 mg daily x 2 days 09/10/19   Charlynne Pander, MD  predniSONE (STERAPRED UNI-PAK 21 TAB) 10 MG (21) TBPK tablet Take by mouth daily. Take 6 tabs by mouth daily  for 2 days, then 5 tabs for 2 days, then 4 tabs for 2 days, then 3 tabs for 2 days, 2 tabs for 2 days, then 1 tab by mouth daily for 2 days 12/18/19   Tegeler, Canary Brim, MD    Allergies    Patient has no known allergies.  Review of Systems   Review of Systems  HENT: Positive for sore throat.   Respiratory: Positive for cough.   All other systems reviewed and are negative.   Physical Exam Updated Vital Signs BP 114/88 (BP Location: Left Arm)   Pulse 93   Temp 99 F (37.2 C) (Oral)   Resp 14  Wt 109.2 kg   SpO2 100%   BMI 36.61 kg/m   Physical Exam Vitals and nursing note reviewed.  Constitutional:      Appearance: Normal appearance.  HENT:     Head: Normocephalic.     Nose: Nose normal.     Mouth/Throat:     Mouth: Mucous membranes are moist.  Eyes:     Extraocular Movements: Extraocular movements intact.     Pupils: Pupils are equal, round, and reactive to light.  Cardiovascular:     Rate and Rhythm: Normal rate and regular rhythm.     Pulses: Normal pulses.     Heart sounds: Normal heart sounds.  Pulmonary:     Effort: Pulmonary effort is normal.     Comments: Dry crackles, no wheezing and no distress.  Abdominal:     General: Abdomen is flat.     Palpations: Abdomen is soft.  Musculoskeletal:        General: Normal range of motion.     Cervical back: Normal range of motion.  Skin:    General: Skin is warm.      Capillary Refill: Capillary refill takes less than 2 seconds.  Neurological:     General: No focal deficit present.     Mental Status: He is alert.  Psychiatric:        Mood and Affect: Mood normal.     ED Results / Procedures / Treatments   Labs (all labs ordered are listed, but only abnormal results are displayed) Labs Reviewed  SARS CORONAVIRUS 2 (TAT 6-24 HRS)    EKG None  Radiology No results found.  Procedures Procedures (including critical care time)  Medications Ordered in ED Medications - No data to display  ED Course  I have reviewed the triage vital signs and the nursing notes.  Pertinent labs & imaging results that were available during my care of the patient were reviewed by me and considered in my medical decision making (see chart for details).    MDM Rules/Calculators/A&P                          Maxwell Kelly is a 39 y.o. male who presented with a cough.  Also has muscle ache and sore throat as well.  She has low-grade temperature in the ED and is not hypoxic.  Patient is overall well-appearing.  Covid test sent and told him to quarantine at home until results come back.  Maxwell Kelly was evaluated in Emergency Department on 12/26/2019 for the symptoms described in the history of present illness. He was evaluated in the context of the global COVID-19 pandemic, which necessitated consideration that the patient might be at risk for infection with the SARS-CoV-2 virus that causes COVID-19. Institutional protocols and algorithms that pertain to the evaluation of patients at risk for COVID-19 are in a state of rapid change based on information released by regulatory bodies including the CDC and federal and state organizations. These policies and algorithms were followed during the patient's care in the ED.   Final Clinical Impression(s) / ED Diagnoses Final diagnoses:  COVID-19 ruled out    Rx / DC Orders ED Discharge Orders    None       Charlynne Pander, MD 12/26/19 2356

## 2019-12-26 NOTE — ED Triage Notes (Signed)
Cough, body aches, sore throat. States onset Thursday. Kids with similar symptoms.

## 2019-12-26 NOTE — Discharge Instructions (Addendum)
He was tested for Covid today and please stay home until your test result comes back.  If you tested positive, you need to quarantine for 10 days per guidelines.  See your doctor for follow-up.  Expect to have muscle aches and fevers and subjective shortness of breath.  Return to ER if you have worse shortness of breath, cough, trouble breathing, fever.     Person Under Monitoring Name: Maxwell Kelly  Location: [address] [place] [address]   Infection Prevention Recommendations for Individuals Confirmed to have, or Being Evaluated for, 2019 Novel Coronavirus (COVID-19) Infection Who Receive Care at Home  Individuals who are confirmed to have, or are being evaluated for, COVID-19 should follow the prevention steps below until a healthcare provider or local or state health department says they can return to normal activities.  Stay home except to get medical care You should restrict activities outside your home, except for getting medical care. Do not go to work, school, or public areas, and do not use public transportation or taxis.  Call ahead before visiting your doctor Before your medical appointment, call the healthcare provider and tell them that you have, or are being evaluated for, COVID-19 infection. This will help the healthcare provider's office take steps to keep other people from getting infected. Ask your healthcare provider to call the local or state health department.  Monitor your symptoms Seek prompt medical attention if your illness is worsening (e.g., difficulty breathing). Before going to your medical appointment, call the healthcare provider and tell them that you have, or are being evaluated for, COVID-19 infection. Ask your healthcare provider to call the local or state health department.  Wear a facemask You should wear a facemask that covers your nose and mouth when you are in the same room with other people and when you visit a  healthcare provider. People who live with or visit you should also wear a facemask while they are in the same room with you.  Separate yourself from other people in your home As much as possible, you should stay in a different room from other people in your home. Also, you should use a separate bathroom, if available.  Avoid sharing household items You should not share dishes, drinking glasses, cups, eating utensils, towels, bedding, or other items with other people in your home. After using these items, you should wash them thoroughly with soap and water.  Cover your coughs and sneezes Cover your mouth and nose with a tissue when you cough or sneeze, or you can cough or sneeze into your sleeve. Throw used tissues in a lined trash can, and immediately wash your hands with soap and water for at least 20 seconds or use an alcohol-based hand rub.  Wash your [employer] your hands often and thoroughly with soap and water for at least 20 seconds. You can use an alcohol-based hand sanitizer if soap and water are not available and if your hands are not visibly dirty. Avoid touching your eyes, nose, and mouth with unwashed hands.   Prevention Steps for Caregivers and Household Members of Individuals Confirmed to have, or Being Evaluated for, COVID-19 Infection Being Cared for in the Home  If you live with, or provide care at home for, a person confirmed to have, or being evaluated for, COVID-19 infection please follow these guidelines to prevent infection:  Follow healthcare provider's instructions Make sure that you understand and can help the patient follow any healthcare provider instructions for  all care.  Provide for the patient's basic needs You should help the patient with basic needs in the home and provide support for getting groceries, prescriptions, and other personal needs.  Monitor the patient's symptoms If they are getting sicker, call his or her medical provider and tell  them that the patient has, or is being evaluated for, COVID-19 infection. This will help the healthcare provider's office take steps to keep other people from getting infected. Ask the healthcare provider to call the local or state health department.  Limit the number of people who have contact with the patient If possible, have only one caregiver for the patient. Other household members should stay in another home or place of residence. If this is not possible, they should stay in another room, or be separated from the patient as much as possible. Use a separate bathroom, if available. Restrict visitors who do not have an essential need to be in the home.  Keep older adults, very young children, and other sick people away from the patient Keep older adults, very young children, and those who have compromised immune systems or chronic health conditions away from the patient. This includes people with chronic heart, lung, or kidney conditions, diabetes, and cancer.  Ensure good ventilation Make sure that shared spaces in the home have good air flow, such as from an air conditioner or an opened window, weather permitting.  Wash your hands often Wash your hands often and thoroughly with soap and water for at least 20 seconds. You can use an alcohol based hand sanitizer if soap and water are not available and if your hands are not visibly dirty. Avoid touching your eyes, nose, and mouth with unwashed hands. Use disposable paper towels to dry your hands. If not available, use dedicated cloth towels and replace them when they become wet.  Wear a facemask and gloves Wear a disposable facemask at all times in the room and gloves when you touch or have contact with the patient's blood, body fluids, and/or secretions or excretions, such as sweat, saliva, sputum, nasal mucus, vomit, urine, or feces.  Ensure the mask fits over your nose and mouth tightly, and do not touch it during use. Throw out  disposable facemasks and gloves after using them. Do not reuse. Wash your hands immediately after removing your facemask and gloves. If your personal clothing becomes contaminated, carefully remove clothing and launder. Wash your hands after handling contaminated clothing. Place all used disposable facemasks, gloves, and other waste in a lined container before disposing them with other household waste. Remove gloves and wash your hands immediately after handling these items.  Do not share dishes, glasses, or other household items with the patient Avoid sharing household items. You should not share dishes, drinking glasses, cups, eating utensils, towels, bedding, or other items with a patient who is confirmed to have, or being evaluated for, COVID-19 infection. After the person uses these items, you should wash them thoroughly with soap and water.  Wash laundry thoroughly Immediately remove and wash clothes or bedding that have blood, body fluids, and/or secretions or excretions, such as sweat, saliva, sputum, nasal mucus, vomit, urine, or feces, on them. Wear gloves when handling laundry from the patient. Read and follow directions on labels of laundry or clothing items and detergent. In general, wash and dry with the warmest temperatures recommended on the label.  Clean all areas the individual has used often Clean all touchable surfaces, such as counters, tabletops, doorknobs, bathroom fixtures, toilets,  phones, keyboards, tablets, and bedside tables, every day. Also, clean any surfaces that may have blood, body fluids, and/or secretions or excretions on them. Wear gloves when cleaning surfaces the patient has come in contact with. Use a diluted bleach solution (e.g., dilute bleach with 1 part bleach and 10 parts water) or a household disinfectant with a label that says EPA-registered for coronaviruses. To make a bleach solution at home, add 1 tablespoon of bleach to 1 quart (4 cups) of water.  For a larger supply, add  cup of bleach to 1 gallon (16 cups) of water. Read labels of cleaning products and follow recommendations provided on product labels. Labels contain instructions for safe and effective use of the cleaning product including precautions you should take when applying the product, such as wearing gloves or eye protection and making sure you have good ventilation during use of the product. Remove gloves and wash hands immediately after cleaning.  Monitor yourself for signs and symptoms of illness Caregivers and household members are considered close contacts, should monitor their health, and will be asked to limit movement outside of the home to the extent possible. Follow the monitoring steps for close contacts listed on the symptom monitoring form.   ? If you have additional questions, contact your local health department or call the epidemiologist on call at [phone] (available 24/7). ? This guidance is subject to change. For the most up-to-date guidance from [facility], please refer to their website: [email or web address]

## 2019-12-27 LAB — SARS CORONAVIRUS 2 (TAT 6-24 HRS): SARS Coronavirus 2: POSITIVE — AB

## 2019-12-28 ENCOUNTER — Telehealth: Payer: Self-pay | Admitting: Adult Health

## 2019-12-28 NOTE — Telephone Encounter (Signed)
Called and talked to patient about monoclonal antibody therapy for the treatment of COVID 19 in those who are at increased risk for hospitalization and/or complications from the virus.  This patient meets criteria based on:BMI greater than 25  After discussion, the patient would like to think about it and will call us back if they decide to pursue therapy.    Call back number: [phone]  My chart message: sent   Lillard Anes, NP

## 2020-07-30 ENCOUNTER — Other Ambulatory Visit: Payer: Self-pay

## 2020-07-30 ENCOUNTER — Emergency Department (HOSPITAL_BASED_OUTPATIENT_CLINIC_OR_DEPARTMENT_OTHER)
Admission: EM | Admit: 2020-07-30 | Discharge: 2020-07-30 | Disposition: A | Discharge status: Home / Self Care | Payer: 59 | Attending: Emergency Medicine | Admitting: Emergency Medicine

## 2020-07-30 ENCOUNTER — Encounter (HOSPITAL_BASED_OUTPATIENT_CLINIC_OR_DEPARTMENT_OTHER): Payer: Self-pay | Admitting: *Deleted

## 2020-07-30 DIAGNOSIS — M722 Plantar fascial fibromatosis: Secondary | ICD-10-CM | POA: Insufficient documentation

## 2020-07-30 DIAGNOSIS — M79671 Pain in right foot: Secondary | ICD-10-CM | POA: Diagnosis present

## 2020-07-30 DIAGNOSIS — Z87891 Personal history of nicotine dependence: Secondary | ICD-10-CM | POA: Diagnosis not present

## 2020-07-30 MED ORDER — NAPROXEN 375 MG PO TABS
375.0000 mg | ORAL_TABLET | Freq: Two times a day (BID) | ORAL | 0 refills | Status: AC
Start: 2020-07-30 — End: ?

## 2020-07-30 NOTE — ED Provider Notes (Signed)
[facility] EMERGENCY DEPARTMENT Provider Note   CSN: [id number] Arrival date & time: 07/30/20  1928     History Chief Complaint  Patient presents with  . Foot Pain    Maxwell Kelly is a 40 y.o. male who presents for evaluation of bilateral foot pain that is been ongoing for years.  He states that he saw a hospital in [place] and was told he has plantar fasciitis.  He states that he does stretches and exercises at home but continues to have pain on the bottoms of his feet.  He states that it is worse when he is up walking around all day on his feet and after he gets home, he states the pain gets worse.  He states it is on the bottom part of his heel and goes into the posterior aspect.  He denies any redness, swelling, fevers, numbness/weakness.  He denies any new traumas, injury, fall.  He has not been taking anything for the pain.  The history is provided by the patient.       Past Medical History:  Diagnosis Date  . Back pain     There are no problems to display for this patient.   Past Surgical History:  Procedure Laterality Date  . WISDOM TOOTH EXTRACTION         No family history on file.  Social History   Tobacco Use  . Smoking status: Former Smoker    Packs/day: 0.50    Types: Cigarettes  . Smokeless tobacco: Never Used  Vaping Use  . Vaping Use: Every day  Substance Use Topics  . Alcohol use: No  . Drug use: No    Home Medications Prior to Admission medications   Medication Sig Start Date End Date Taking? Authorizing Provider  naproxen (NAPROSYN) 375 MG tablet Take 1 tablet (375 mg total) by mouth 2 (two) times daily. 07/30/20  Yes Maxwell Caul, PA-C  HYDROcodone-acetaminophen (NORCO/VICODIN) 5-325 MG tablet Take 1 tablet by mouth every 6 (six) hours as needed. 09/10/19   Charlynne Pander, MD  ibuprofen (ADVIL) 800 MG tablet Take 1 tablet (800 mg total) by mouth 3 (three) times daily. 09/10/19   Charlynne Pander, MD  lidocaine  (LIDODERM) 5 % Place 1 patch onto the skin daily. Remove & Discard patch within 12 hours or as directed by MD 09/10/19   Charlynne Pander, MD  lidocaine (LIDODERM) 5 % Place 1 patch onto the skin daily. Remove & Discard patch within 12 hours or as directed by MD 12/18/19   Tegeler, Canary Brim, MD  methocarbamol (ROBAXIN) 500 MG tablet Take 1 tablet (500 mg total) by mouth at bedtime as needed. 09/10/19   Charlynne Pander, MD  methocarbamol (ROBAXIN) 500 MG tablet Take 1 tablet (500 mg total) by mouth 2 (two) times daily. 12/18/19   Tegeler, Canary Brim, MD  predniSONE (DELTASONE) 20 MG tablet Take 60 mg daily x 2 days then 40 mg daily x 2 days then 20 mg daily x 2 days 09/10/19   Charlynne Pander, MD  predniSONE (STERAPRED UNI-PAK 21 TAB) 10 MG (21) TBPK tablet Take by mouth daily. Take 6 tabs by mouth daily  for 2 days, then 5 tabs for 2 days, then 4 tabs for 2 days, then 3 tabs for 2 days, 2 tabs for 2 days, then 1 tab by mouth daily for 2 days 12/18/19   Tegeler, Canary Brim, MD    Allergies    Patient has  no known allergies.  Review of Systems   Review of Systems  Constitutional: Negative for fever.  Musculoskeletal:       Foot pain  Skin: Negative for color change.  Neurological: Negative for weakness and numbness.  All other systems reviewed and are negative.   Physical Exam Updated Vital Signs BP 134/84 (BP Location: Left Arm)   Pulse 65   Temp 98.1 F (36.7 C) (Oral)   Resp 18   Ht 5\' 9"  (1.753 m)   Wt 117.9 kg   SpO2 98%   BMI 38.40 kg/m   Physical Exam Vitals and nursing note reviewed.  Constitutional:      Appearance: He is well-developed.  HENT:     Head: Normocephalic and atraumatic.  Eyes:     General: No scleral icterus.       Right eye: No discharge.        Left eye: No discharge.     Conjunctiva/sclera: Conjunctivae normal.  Cardiovascular:     Pulses:          Dorsalis pedis pulses are 2+ on the right side and 2+ on the left side.  Pulmonary:      Effort: Pulmonary effort is normal.  Musculoskeletal:     Comments: Tenderness palpation noted to the plantar surface of bilateral heels.  This is exacerbated by movement of his feet.  Dorsiflexion and plantarflexion intact.  He wiggle all 5 toes bilaterally.  No tenderness palpation noted to bilateral tib-fib's.  Skin:    General: Skin is warm and dry.     Capillary Refill: Capillary refill takes less than 2 seconds.     Comments: Good distal cap refill. BLE is not dusky in appearance or cool to touch.  Bilateral feet with no overlying warmth, erythema, edema.  Neurological:     Mental Status: He is alert.     Comments: Sensation intact along major nerve distributions of BLE  Psychiatric:        Speech: Speech normal.        Behavior: Behavior normal.     ED Results / Procedures / Treatments   Labs (all labs ordered are listed, but only abnormal results are displayed) Labs Reviewed - No data to display  EKG None  Radiology No results found.  Procedures Procedures   Medications Ordered in ED Medications - No data to display  ED Course  I have reviewed the triage vital signs and the nursing notes.  Pertinent labs & imaging results that were available during my care of the patient were reviewed by me and considered in my medical decision making (see chart for details).    MDM Rules/Calculators/A&P                          40 year old male who presents for evaluation of bilateral feet pain which she states has been an ongoing issue for years.  He was told that it was plantar fasciitis.  He states that he has continued pain.  No to new trauma, injury.  No fevers, numbness/weakness, warmth, redness.  On initial arrival, he is afebrile, nontoxic-appearing.  Vital signs are stable.  On exam, he has good distal pulses, cap refill.  There is no overlying warmth, erythema noted to the feet.  He has tenderness palpation noted plantar surface of the heels that is worse with  movement consistent with plantar fasciitis.  History/physical exam not concerning for ischemic limb, septic arthritis.  Additionally, given lack  of trauma, injury, fall, do not suspect acute fracture or dislocation.  No indication for x-ray imaging at this time.  Discussed at home supportive care measures.  Discussed with patient that given the chronicity of symptoms, he will need to follow-up with podiatry for further evaluation and management of his symptoms. At this time, patient exhibits no emergent life-threatening condition that require further evaluation in ED. Patient had ample opportunity for questions and discussion. All patient's questions were answered with full understanding. Strict return precautions discussed. Patient expresses understanding and agreement to plan.   Portions of this note were generated with Scientist, clinical (histocompatibility and immunogenetics). Dictation errors may occur despite best attempts at proofreading.  Final Clinical Impression(s) / ED Diagnoses Final diagnoses:  Plantar fasciitis    Rx / DC Orders ED Discharge Orders         Ordered    naproxen (NAPROSYN) 375 MG tablet  2 times daily        07/30/20 2007           Rosana Hoes 07/30/20 2318    Tegeler, Canary Brim, MD 07/30/20 [phone]

## 2020-07-30 NOTE — ED Triage Notes (Signed)
Pt reports pain in both feet for years- states he has been told he has plantar fascitis

## 2020-07-30 NOTE — Discharge Instructions (Signed)
You can take 1000 mg of Tylenol.  Do not exceed 4000 mg of Tylenol a day.  You can take naproxen as directed.  Do not take ibuprofen at the same time as naproxen.  As we discussed, continue with home stretches, freezing water bottle.  Follow-up with referred podiatry.  Return to emergency department for redness or swelling of feet, numbness/weakness or any other worsening concerning symptoms.

## 2024-05-07 ENCOUNTER — Encounter (HOSPITAL_BASED_OUTPATIENT_CLINIC_OR_DEPARTMENT_OTHER): Payer: Self-pay | Admitting: Emergency Medicine

## 2024-05-07 ENCOUNTER — Emergency Department (HOSPITAL_BASED_OUTPATIENT_CLINIC_OR_DEPARTMENT_OTHER)
Admission: EM | Admit: 2024-05-07 | Discharge: 2024-05-07 | Disposition: A | Discharge status: Home / Self Care | Payer: Self-pay | Source: Home / Self Care | Attending: Emergency Medicine | Admitting: Emergency Medicine

## 2024-05-07 ENCOUNTER — Other Ambulatory Visit: Payer: Self-pay

## 2024-05-07 DIAGNOSIS — M79604 Pain in right leg: Secondary | ICD-10-CM

## 2024-05-07 DIAGNOSIS — M5441 Lumbago with sciatica, right side: Secondary | ICD-10-CM | POA: Insufficient documentation

## 2024-05-07 DIAGNOSIS — M5416 Radiculopathy, lumbar region: Secondary | ICD-10-CM | POA: Insufficient documentation

## 2024-05-07 DIAGNOSIS — M62830 Muscle spasm of back: Secondary | ICD-10-CM

## 2024-05-07 DIAGNOSIS — M5410 Radiculopathy, site unspecified: Secondary | ICD-10-CM

## 2024-05-07 MED ORDER — MORPHINE SULFATE (PF) 4 MG/ML IV SOLN
4.0000 mg | Freq: Once | INTRAVENOUS | Status: AC
  Administered 2024-05-07: 21:00:00 4 mg via INTRAMUSCULAR
  Filled 2024-05-07: qty 1

## 2024-05-07 MED ORDER — METHYLPREDNISOLONE 4 MG PO TBPK: ORAL_TABLET | ORAL | 0 refills | Status: AC

## 2024-05-07 MED ORDER — CYCLOBENZAPRINE HCL 10 MG PO TABS: 10.0000 mg | ORAL_TABLET | Freq: Two times a day (BID) | ORAL | 0 refills | Status: AC | PRN

## 2024-05-07 MED ORDER — METHYLPREDNISOLONE 4 MG PO TBPK: ORAL_TABLET | ORAL | 0 refills | Status: DC

## 2024-05-07 NOTE — ED Triage Notes (Signed)
 Pt c/o R side sciatic pain x 1 day.  Intermittent numbness to affected extremity.   Took morphine  pill today, appx 1500.

## 2024-05-07 NOTE — Discharge Instructions (Signed)
 Your history, exam, and evaluation are consistent with nerve pain called sciatica down your right leg from your low back.  There was associated spasm on exam and your pain is similar to what you have been treated for in the past.  Please use the muscle relaxant, the numbing patches you have at home, and the steroid pack to help with the nerve inflammation and spasm.  Please follow-up with a back doctor and your primary doctor.  Please rest and stay hydrated.  If any symptoms change or worsen acutely, please return to the nearest emergency department.  We agreed with a shared decision making conversation to hold on more extensive lab or imaging workup today.

## 2024-05-07 NOTE — ED Provider Notes (Signed)
 [place] [facility] Provider Note   CSN: [id number] Arrival date & time: 05/07/24  1857     Patient presents with: Leg Pain   Maxwell Kelly is a 43 y.o. male.   The history is provided by the patient and medical records. No language interpreter was used.  Leg Pain Location:  Leg Time since incident:  2 days Injury: no   Leg location:  R leg Pain details:    Quality:  Tingling and shooting   Radiates to:  Does not radiate   Severity:  Severe   Onset quality:  Gradual   Duration:  2 days   Timing:  Constant   Progression:  Waxing and waning Chronicity:  Recurrent Dislocation: no   Tetanus status:  Unknown Prior injury to area:  No Relieved by:  Nothing Ineffective treatments:  None tried Associated symptoms: back pain   Associated symptoms: no decreased ROM, no fatigue, no fever, no itching, no muscle weakness, no neck pain, no numbness, no stiffness, no swelling and no tingling        Prior to Admission medications  Medication Sig Start Date End Date Taking? Authorizing Provider  HYDROcodone -acetaminophen  (NORCO/VICODIN) 5-325 MG tablet Take 1 tablet by mouth every 6 (six) hours as needed. 09/10/19   Patt Alm Macho, MD  ibuprofen  (ADVIL ) 800 MG tablet Take 1 tablet (800 mg total) by mouth 3 (three) times daily. 09/10/19   Patt Alm Macho, MD  lidocaine  (LIDODERM ) 5 % Place 1 patch onto the skin daily. Remove & Discard patch within 12 hours or as directed by MD 09/10/19   Patt Alm Macho, MD  lidocaine  (LIDODERM ) 5 % Place 1 patch onto the skin daily. Remove & Discard patch within 12 hours or as directed by MD 12/18/19   Tanith Dagostino, Lonni PARAS, MD  methocarbamol  (ROBAXIN ) 500 MG tablet Take 1 tablet (500 mg total) by mouth at bedtime as needed. 09/10/19   Patt Alm Macho, MD  methocarbamol  (ROBAXIN ) 500 MG tablet Take 1 tablet (500 mg total) by mouth 2 (two) times daily. 12/18/19   Breckin Zafar, Lonni PARAS, MD  naproxen   (NAPROSYN ) 375 MG tablet Take 1 tablet (375 mg total) by mouth 2 (two) times daily. 07/30/20   Layden, Lindsey A, PA-C  predniSONE  (DELTASONE ) 20 MG tablet Take 60 mg daily x 2 days then 40 mg daily x 2 days then 20 mg daily x 2 days 09/10/19   Patt Alm Macho, MD  predniSONE  (STERAPRED UNI-PAK 21 TAB) 10 MG (21) TBPK tablet Take by mouth daily. Take 6 tabs by mouth daily  for 2 days, then 5 tabs for 2 days, then 4 tabs for 2 days, then 3 tabs for 2 days, 2 tabs for 2 days, then 1 tab by mouth daily for 2 days 12/18/19   Coriann Brouhard, Lonni PARAS, MD    Allergies: Patient has no known allergies.    Review of Systems  Constitutional:  Negative for chills, fatigue and fever.  HENT:  Negative for congestion.   Respiratory:  Negative for cough, chest tightness, shortness of breath and wheezing.   Cardiovascular:  Negative for chest pain, palpitations and leg swelling.  Gastrointestinal:  Negative for abdominal pain, constipation, diarrhea, nausea and vomiting.  Genitourinary:  Negative for dysuria.  Musculoskeletal:  Positive for back pain. Negative for neck pain, neck stiffness and stiffness.  Skin:  Negative for itching, rash and wound.  Neurological:  Negative for speech difficulty, weakness, light-headedness, numbness and headaches.  Psychiatric/Behavioral:  Negative for agitation.   All other systems reviewed and are negative.   Updated Vital Signs BP 118/73 (BP Location: Right Arm)   Pulse 81   Temp 98.5 F (36.9 C) (Oral)   Resp 18   Ht 5' 9 (1.753 m)   Wt 100.7 kg   SpO2 100%   BMI 32.78 kg/m   Physical Exam Vitals and nursing note reviewed.  Constitutional:      General: He is not in acute distress.    Appearance: He is well-developed. He is not ill-appearing, toxic-appearing or diaphoretic.  HENT:     Head: Normocephalic and atraumatic.     Nose: No congestion or rhinorrhea.     Mouth/Throat:     Mouth: Mucous membranes are moist.     Pharynx: No oropharyngeal exudate  or posterior oropharyngeal erythema.  Eyes:     Extraocular Movements: Extraocular movements intact.     Conjunctiva/sclera: Conjunctivae normal.     Pupils: Pupils are equal, round, and reactive to light.  Cardiovascular:     Rate and Rhythm: Normal rate and regular rhythm.     Heart sounds: No murmur heard. Pulmonary:     Effort: Pulmonary effort is normal. No respiratory distress.     Breath sounds: Normal breath sounds. No wheezing, rhonchi or rales.  Chest:     Chest wall: No tenderness.  Abdominal:     General: Abdomen is flat.     Palpations: Abdomen is soft.     Tenderness: There is no abdominal tenderness.  Musculoskeletal:        General: Tenderness present. No swelling.     Cervical back: Neck supple.     Right lower leg: No edema.     Left lower leg: No edema.       Legs:     Comments: Straight leg raise positive on right leg.  Tenderness and pain and spasm in his right low back going down his right leg.  Intact sensation strength and pulse.  No numbness or weakness on exam.  Skin:    General: Skin is warm and dry.     Capillary Refill: Capillary refill takes less than 2 seconds.     Findings: No erythema or rash.  Neurological:     General: No focal deficit present.     Mental Status: He is alert.     Sensory: No sensory deficit.     Motor: No weakness.  Psychiatric:        Mood and Affect: Mood normal.     (all labs ordered are listed, but only abnormal results are displayed) Labs Reviewed - No data to display  EKG: None  Radiology: No results found.   Procedures   Medications Ordered in the ED  morphine  (PF) 4 MG/ML injection 4 mg (4 mg Intramuscular Given 05/07/24 2112)                                    Medical Decision Making   Maxwell Kelly is a 43 y.o. male with a past medical history significant for previous sciatic back pain and spasm who presents with right leg pain coming from his back.  He reports that he had been out of work for  quite some time but recently started few weeks ago doing more active work.  He reports that he started having pain 3 weeks ago that improved but then over the last 24  hours started having pain again.  He reports the pain is in his right low back and shoots down his right leg.  He occasionally some tingling but no numbness.  No weakness.  No loss of bowel or bladder control.  No new trauma.  He denies any fevers, chills, cough.  Denies any abdominal pain chest pain or upper back pain.  Does not report any history of infection in his back.  He is able to ambulate.  He says it feels just like when he had a sciatic nerve pain in the past.  He reports that previously he has managed it with a steroid pack, muscle medicines and numbing patches.  He reports he has lidocaine  patches at home and some pain medicine as well.  On exam, lungs clear.  Chest nontender.  Abdomen nontender.  He had intact sensation strength and pulses in lower extremities.  He does have a positive straight leg raise on the right.  Tenderness in his right low back with spasm.  Midline nontender.  Patient otherwise well-appearing.  Patient requested a dose of IM pain medicine and he would like some medications to go home management.  He would like a number for a back doctor as this has happened to him in the past.  We had a shared decision making conversation and agreed to hold on extensive lab or imaging testing at this time as there is no concern for infection at this time and there is no new trauma to likely warrant advanced imaging today.  He will get a prescription for Flexeril  and a Medrol  Dosepak and he will get a shot of pain medicine here.  Will plan for discharge and he can take his Lidoderm  and pain medicine at home and follow-up with a back doctor for what I suspect is radicular nerve type pain going down his right leg consistent with his prior episodes.  He agrees with plan of care and follow-up instructions and was discharged in  good condition.       Final diagnoses:  Right leg pain  Radicular pain  Acute right-sided low back pain with right-sided sciatica  Muscle spasm of back    ED Discharge Orders          Ordered    cyclobenzaprine  (FLEXERIL ) 10 MG tablet  2 times daily PRN        05/07/24 2113    methylPREDNISolone  (MEDROL  DOSEPAK) 4 MG TBPK tablet  Status:  Discontinued        05/07/24 2113    methylPREDNISolone  (MEDROL  DOSEPAK) 4 MG TBPK tablet        05/07/24 2114           Clinical Impression: 1. Right leg pain   2. Radicular pain   3. Acute right-sided low back pain with right-sided sciatica   4. Muscle spasm of back     Disposition: Discharge  Condition: Good  I have discussed the results, Dx and Tx plan with the pt(& family if present). He/she/they expressed understanding and agree(s) with the plan. Discharge instructions discussed at great length. Strict return precautions discussed and pt &/or family have verbalized understanding of the instructions. No further questions at time of discharge.    New Prescriptions   CYCLOBENZAPRINE  (FLEXERIL ) 10 MG TABLET    Take 1 tablet (10 mg total) by mouth 2 (two) times daily as needed for muscle spasms.   METHYLPREDNISOLONE  (MEDROL  DOSEPAK) 4 MG TBPK TABLET    Please follow directions on Dosepak  Follow Up: [facility] [address] [place] [address] [phone]          Chi Woodham, Lonni PARAS, MD 05/07/24 2128
